# Patient Record
Sex: Female | Born: 1945 | Hispanic: No | Marital: Married | Smoking: Never smoker
Health system: Southern US, Community
[De-identification: ages and names within clinical notes are randomized; demographics above are authoritative.]

## PROBLEM LIST (undated history)

## (undated) DIAGNOSIS — C801 Malignant (primary) neoplasm, unspecified: Secondary | ICD-10-CM

## (undated) DIAGNOSIS — I10 Essential (primary) hypertension: Secondary | ICD-10-CM

## (undated) DIAGNOSIS — M1990 Unspecified osteoarthritis, unspecified site: Secondary | ICD-10-CM

## (undated) DIAGNOSIS — E785 Hyperlipidemia, unspecified: Secondary | ICD-10-CM

## (undated) DIAGNOSIS — M810 Age-related osteoporosis without current pathological fracture: Secondary | ICD-10-CM

## (undated) HISTORY — PX: TUBAL LIGATION: SHX77

## (undated) HISTORY — PX: CHOLECYSTECTOMY: SHX55

## (undated) HISTORY — DX: Unspecified osteoarthritis, unspecified site: M19.90

## (undated) HISTORY — DX: Age-related osteoporosis without current pathological fracture: M81.0

## (undated) HISTORY — DX: Essential (primary) hypertension: I10

## (undated) HISTORY — DX: Malignant (primary) neoplasm, unspecified: C80.1

## (undated) HISTORY — PX: OTHER SURGICAL HISTORY: SHX169

## (undated) HISTORY — DX: Hyperlipidemia, unspecified: E78.5

---

## 2000-06-20 ENCOUNTER — Other Ambulatory Visit: Admission: RE | Admit: 2000-06-20 | Discharge: 2000-06-20 | Payer: Self-pay | Admitting: Unknown Physician Specialty

## 2002-11-30 ENCOUNTER — Other Ambulatory Visit: Admission: RE | Admit: 2002-11-30 | Discharge: 2002-11-30 | Payer: Self-pay | Admitting: Family Medicine

## 2003-12-08 ENCOUNTER — Other Ambulatory Visit: Admission: RE | Admit: 2003-12-08 | Discharge: 2003-12-08 | Payer: Self-pay | Admitting: Family Medicine

## 2004-12-21 ENCOUNTER — Other Ambulatory Visit: Admission: RE | Admit: 2004-12-21 | Discharge: 2004-12-21 | Payer: Self-pay | Admitting: Family Medicine

## 2005-12-12 ENCOUNTER — Other Ambulatory Visit: Admission: RE | Admit: 2005-12-12 | Discharge: 2005-12-12 | Payer: Self-pay | Admitting: Family Medicine

## 2008-08-25 ENCOUNTER — Ambulatory Visit: Payer: Self-pay | Admitting: Vascular Surgery

## 2009-05-11 ENCOUNTER — Encounter (INDEPENDENT_AMBULATORY_CARE_PROVIDER_SITE_OTHER): Payer: Self-pay | Admitting: *Deleted

## 2010-12-05 NOTE — Consult Note (Signed)
NEW PATIENT CONSULTATION   Yvonne Rivers, Yvonne Rivers  DOB:  Feb 29, 1946                                       08/25/2008  CHART#:[id number]   The patient presents today for concern regarding discomfort in her lower  extremities.  She reports that this occurs most frequently at night  where she has a burning sensation on the bottoms of her feet.  She also  can have alternating hot and cold sensations.  She reports this began  around the time of Christmas.  She is also concerned regarding a strong  family history of premature atherosclerotic disease with cardiac  disease.  She has 13 siblings and multiple of these have had coronary  artery bypass grafting and stenting at a young age.  She does not have  any history of hypertension or diabetes.  She does have elevated  cholesterol and is on appropriate treatment for this.   SOCIAL HISTORY:  She is married with two children.  She works as a  Child psychotherapist.  She does not smoke or drink alcohol.  She has never smoked.   REVIEW OF SYSTEMS:  Her weight is reported at 137 pounds.  She is 5 feet  1-1/2 inches tall.  She denies cardiac, pulmonary, GI or GU discomfort.  She also denies any stroke or orthopedic difficulties.   ALLERGIES:  No known drug allergies.   MEDICATIONS:  1. Simvastatin.  2. Zolpidem.  3. Aspirin.  4. Fish oil.  5. Multivitamin.   PHYSICAL EXAMINATION:  Vital signs:  Blood pressure is 172/93, pulse is  74, respirations 18.  General:  She is a well-developed, well-nourished  in no acute distress.  She is grossly intact neurologically.  Her radial  pulses are 2+ bilaterally.  She has 2+ popliteal and 2+ posterior tibial  pulses bilaterally.  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  She does not have  any varicose veins and no chronic swelling or skin changes associated  with venous hypertension.  She underwent vascular laboratory studies in  our  office which reveals normal ankle arm indices bilaterally and normal  triphasic waveforms.   I discussed this at length with the patient.  I explained that she does  not appear to have any evidence of significant arterial or venous  pathology aside from her telangiectasia.  I have explained that I am  unclear as to the etiology of her paresthesias in her feet but these do  not appear to be related to vascular insufficiency.  She is questioning  regarding cardiac evaluation.  Apparently she is scheduled for a stress  test in March.  I feel this is appropriate due to her very strong family  history.  She will see [place] again on an as needed basis.   Larina Earthly, M.D.  Electronically Signed   TFE/MEDQ  D:  08/25/2008  T:  08/26/2008  Job:  [id number]   cc:   Ernestina Penna, M.D.

## 2012-11-19 ENCOUNTER — Other Ambulatory Visit: Payer: Self-pay | Admitting: Family Medicine

## 2013-01-09 ENCOUNTER — Other Ambulatory Visit: Payer: Self-pay

## 2013-01-09 NOTE — Telephone Encounter (Signed)
Last seen 2/14  [facility]  If approved call in and nurse notify patient

## 2013-01-10 MED ORDER — ZOLPIDEM TARTRATE 10 MG PO TABS
10.0000 mg | ORAL_TABLET | Freq: Every evening | ORAL | Status: DC | PRN
Start: 2013-01-09 — End: 2013-02-05

## 2013-01-10 NOTE — Telephone Encounter (Signed)
Please call in ambien rx with 2 refills 

## 2013-01-10 NOTE — Telephone Encounter (Signed)
CALLED INTO PHARMACY

## 2013-02-05 ENCOUNTER — Encounter: Payer: Self-pay | Admitting: General Practice

## 2013-02-05 ENCOUNTER — Ambulatory Visit (INDEPENDENT_AMBULATORY_CARE_PROVIDER_SITE_OTHER): Payer: BC Managed Care – PPO | Admitting: General Practice

## 2013-02-05 VITALS — BP 127/81 | HR 68 | Temp 97.2°F | Ht 60.0 in | Wt 120.0 lb

## 2013-02-05 DIAGNOSIS — M1990 Unspecified osteoarthritis, unspecified site: Secondary | ICD-10-CM

## 2013-02-05 DIAGNOSIS — M129 Arthropathy, unspecified: Secondary | ICD-10-CM

## 2013-02-05 DIAGNOSIS — I10 Essential (primary) hypertension: Secondary | ICD-10-CM

## 2013-02-05 DIAGNOSIS — Z09 Encounter for follow-up examination after completed treatment for conditions other than malignant neoplasm: Secondary | ICD-10-CM

## 2013-02-05 DIAGNOSIS — G4700 Insomnia, unspecified: Secondary | ICD-10-CM

## 2013-02-05 DIAGNOSIS — E785 Hyperlipidemia, unspecified: Secondary | ICD-10-CM

## 2013-02-05 DIAGNOSIS — G609 Hereditary and idiopathic neuropathy, unspecified: Secondary | ICD-10-CM

## 2013-02-05 LAB — COMPLETE METABOLIC PANEL WITH GFR
ALT: 20 U/L (ref 0–35)
CO2: 29 mEq/L (ref 19–32)
Calcium: 9.6 mg/dL (ref 8.4–10.5)
Chloride: 101 mEq/L (ref 96–112)
Creat: 0.69 mg/dL (ref 0.50–1.10)
GFR, Est African American: >89 mL/min

## 2013-02-05 LAB — POCT CBC
HCT, POC: 39.9 % (ref 37.7–47.9)
Hemoglobin: 13.9 g/dL (ref 12.2–16.2)
MCH, POC: 32.1 pg — AB (ref 27–31.2)
MCV: 92.7 fL (ref 80–97)
MPV: 8.3 fL (ref 0–99.8)
POC LYMPH PERCENT: 39.1 %L (ref 10–50)
RBC: 4.3 M/uL (ref 4.04–5.48)

## 2013-02-05 MED ORDER — LISINOPRIL 40 MG PO TABS: 40.0000 mg | ORAL_TABLET | Freq: Every day | ORAL | Status: DC

## 2013-02-05 MED ORDER — MELOXICAM 15 MG PO TABS: 15.0000 mg | ORAL_TABLET | Freq: Every day | ORAL | Status: DC

## 2013-02-05 MED ORDER — ATORVASTATIN CALCIUM 20 MG PO TABS: 20.0000 mg | ORAL_TABLET | Freq: Every day | ORAL | Status: DC

## 2013-02-05 MED ORDER — ZOLPIDEM TARTRATE 10 MG PO TABS: 10.0000 mg | ORAL_TABLET | Freq: Every evening | ORAL | Status: DC | PRN

## 2013-02-05 MED ORDER — GABAPENTIN 300 MG PO CAPS: 300.0000 mg | ORAL_CAPSULE | Freq: Three times a day (TID) | ORAL | Status: DC

## 2013-02-05 NOTE — Patient Instructions (Signed)
Hypertriglyceridemia  Diet for High blood levels of Triglycerides Most fats in food are triglycerides. Triglycerides in your blood are stored as fat in your body. High levels of triglycerides in your blood may put you at a greater risk for heart disease and stroke.  Normal triglyceride levels are less than 150 mg/dL. Borderline high levels are 150-199 mg/dl. High levels are 200 - 499 mg/dL, and very high triglyceride levels are greater than 500 mg/dL. The decision to treat high triglycerides is generally based on the level. For people with borderline or high triglyceride levels, treatment includes weight loss and exercise. Drugs are recommended for people with very high triglyceride levels. Many people who need treatment for high triglyceride levels have metabolic syndrome. This syndrome is a collection of disorders that often include: insulin resistance, high blood pressure, blood clotting problems, high cholesterol and triglycerides. TESTING PROCEDURE FOR TRIGLYCERIDES  You should not eat 4 hours before getting your triglycerides measured. The normal range of triglycerides is between 10 and 250 milligrams per deciliter (mg/dl). Some people may have extreme levels (1000 or above), but your triglyceride level may be too high if it is above 150 mg/dl, depending on what other risk factors you have for heart disease.  People with high blood triglycerides may also have high blood cholesterol levels. If you have high blood cholesterol as well as high blood triglycerides, your risk for heart disease is probably greater than if you only had high triglycerides. High blood cholesterol is one of the main risk factors for heart disease. CHANGING YOUR DIET  Your weight can affect your blood triglyceride level. If you are more than 20% above your ideal body weight, you may be able to lower your blood triglycerides by losing weight. Eating less and exercising regularly is the best way to combat this. Fat provides more  calories than any other food. The best way to lose weight is to eat less fat. Only 30% of your total calories should come from fat. Less than 7% of your diet should come from saturated fat. A diet low in fat and saturated fat is the same as a diet to decrease blood cholesterol. By eating a diet lower in fat, you may lose weight, lower your blood cholesterol, and lower your blood triglyceride level.  Eating a diet low in fat, especially saturated fat, may also help you lower your blood triglyceride level. Ask your dietitian to help you figure how much fat you can eat based on the number of calories your caregiver has prescribed for you.  Exercise, in addition to helping with weight loss may also help lower triglyceride levels.   Alcohol can increase blood triglycerides. You may need to stop drinking alcoholic beverages.  Too much carbohydrate in your diet may also increase your blood triglycerides. Some complex carbohydrates are necessary in your diet. These may include bread, rice, potatoes, other starchy vegetables and cereals.  Reduce "simple" carbohydrates. These may include pure sugars, candy, honey, and jelly without losing other nutrients. If you have the kind of high blood triglycerides that is affected by the amount of carbohydrates in your diet, you will need to eat less sugar and less high-sugar foods. Your caregiver can help you with this.  Adding 2-4 grams of fish oil (EPA+ DHA) may also help lower triglycerides. Speak with your caregiver before adding any supplements to your regimen. Following the Diet  Maintain your ideal weight. Your caregivers can help you with a diet. Generally, eating less food and getting more   exercise will help you lose weight. Joining a weight control group may also help. Ask your caregivers for a good weight control group in your area.  Eat low-fat foods instead of high-fat foods. This can help you lose weight too.  These foods are lower in fat. Eat MORE of these:    Dried beans, peas, and lentils.  Egg whites.  Low-fat cottage cheese.  Fish.  Lean cuts of meat, such as round, sirloin, rump, and flank (cut extra fat off meat you fix).  Whole grain breads, cereals and pasta.  Skim and nonfat dry milk.  Low-fat yogurt.  Poultry without the skin.  Cheese made with skim or part-skim milk, such as mozzarella, parmesan, farmers', ricotta, or pot cheese. These are higher fat foods. Eat LESS of these:   Whole milk and foods made from whole milk, such as American, blue, cheddar, monterey jack, and swiss cheese  High-fat meats, such as luncheon meats, sausages, knockwurst, bratwurst, hot dogs, ribs, corned beef, ground pork, and regular ground beef.  Fried foods. Limit saturated fats in your diet. Substituting unsaturated fat for saturated fat may decrease your blood triglyceride level. You will need to read package labels to know which products contain saturated fats.  These foods are high in saturated fat. Eat LESS of these:   Fried pork skins.  Whole milk.  Skin and fat from poultry.  Palm oil.  Butter.  Shortening.  Cream cheese.  Bacon.  Margarines and baked goods made from listed oils.  Vegetable shortenings.  Chitterlings.  Fat from meats.  Coconut oil.  Palm kernel oil.  Lard.  Cream.  Sour cream.  Fatback.  Coffee whiteners and non-dairy creamers made with these oils.  Cheese made from whole milk. Use unsaturated fats (both polyunsaturated and monounsaturated) moderately. Remember, even though unsaturated fats are better than saturated fats; you still want a diet low in total fat.  These foods are high in unsaturated fat:   Canola oil.  Sunflower oil.  Mayonnaise.  Almonds.  Peanuts.  Pine nuts.  Margarines made with these oils.  Safflower oil.  Olive oil.  Avocados.  Cashews.  Peanut butter.  Sunflower seeds.  Soybean oil.  Peanut  oil.  Olives.  Pecans.  Walnuts.  Pumpkin seeds. Avoid sugar and other high-sugar foods. This will decrease carbohydrates without decreasing other nutrients. Sugar in your food goes rapidly to your blood. When there is excess sugar in your blood, your liver may use it to make more triglycerides. Sugar also contains calories without other important nutrients.  Eat LESS of these:   Sugar, brown sugar, powdered sugar, jam, jelly, preserves, honey, syrup, molasses, pies, candy, cakes, cookies, frosting, pastries, colas, soft drinks, punches, fruit drinks, and regular gelatin.  Avoid alcohol. Alcohol, even more than sugar, may increase blood triglycerides. In addition, alcohol is high in calories and low in nutrients. Ask for sparkling water, or a diet soft drink instead of an alcoholic beverage. Suggestions for planning and preparing meals   Bake, broil, grill or roast meats instead of frying.  Remove fat from meats and skin from poultry before cooking.  Add spices, herbs, lemon juice or vinegar to vegetables instead of salt, rich sauces or gravies.  Use a non-stick skillet without fat or use no-stick sprays.  Cool and refrigerate stews and broth. Then remove the hardened fat floating on the surface before serving.  Refrigerate meat drippings and skim off fat to make low-fat gravies.  Serve more fish.  Use less butter,   margarine and other high-fat spreads on bread or vegetables.  Use skim or reconstituted non-fat dry milk for cooking.  Cook with low-fat cheeses.  Substitute low-fat yogurt or cottage cheese for all or part of the sour cream in recipes for sauces, dips or congealed salads.  Use half yogurt/half mayonnaise in salad recipes.  Substitute evaporated skim milk for cream. Evaporated skim milk or reconstituted non-fat dry milk can be whipped and substituted for whipped cream in certain recipes.  Choose fresh fruits for dessert instead of high-fat foods such as pies or  cakes. Fruits are naturally low in fat. When Dining Out   Order low-fat appetizers such as fruit or vegetable juice, pasta with vegetables or tomato sauce.  Select clear, rather than cream soups.  Ask that dressings and gravies be served on the side. Then use less of them.  Order foods that are baked, broiled, poached, steamed, stir-fried, or roasted.  Ask for margarine instead of butter, and use only a small amount.  Drink sparkling water, unsweetened tea or coffee, or diet soft drinks instead of alcohol or other sweet beverages. QUESTIONS AND ANSWERS ABOUT OTHER FATS IN THE BLOOD: SATURATED FAT, TRANS FAT, AND CHOLESTEROL What is trans fat? Trans fat is a type of fat that is formed when vegetable oil is hardened through a process called hydrogenation. This process helps makes foods more solid, gives them shape, and prolongs their shelf life. Trans fats are also called hydrogenated or partially hydrogenated oils.  What do saturated fat, trans fat, and cholesterol in foods have to do with heart disease? Saturated fat, trans fat, and cholesterol in the diet all raise the level of LDL "bad" cholesterol in the blood. The higher the LDL cholesterol, the greater the risk for coronary heart disease (CHD). Saturated fat and trans fat raise LDL similarly.  What foods contain saturated fat, trans fat, and cholesterol? High amounts of saturated fat are found in animal products, such as fatty cuts of meat, chicken skin, and full-fat dairy products like butter, whole milk, cream, and cheese, and in tropical vegetable oils such as palm, palm kernel, and coconut oil. Trans fat is found in some of the same foods as saturated fat, such as vegetable shortening, some margarines (especially hard or stick margarine), crackers, cookies, baked goods, fried foods, salad dressings, and other processed foods made with partially hydrogenated vegetable oils. Small amounts of trans fat also occur naturally in some animal  products, such as milk products, beef, and lamb. Foods high in cholesterol include liver, other organ meats, egg yolks, shrimp, and full-fat dairy products. How can I use the new food label to make heart-healthy food choices? Check the Nutrition Facts panel of the food label. Choose foods lower in saturated fat, trans fat, and cholesterol. For saturated fat and cholesterol, you can also use the Percent Daily Value (%DV): 5% DV or less is low, and 20% DV or more is high. (There is no %DV for trans fat.) Use the Nutrition Facts panel to choose foods low in saturated fat and cholesterol, and if the trans fat is not listed, read the ingredients and limit products that list shortening or hydrogenated or partially hydrogenated vegetable oil, which tend to be high in trans fat. POINTS TO REMEMBER:   Discuss your risk for heart disease with your caregivers, and take steps to reduce risk factors.  Change your diet. Choose foods that are low in saturated fat, trans fat, and cholesterol.  Add exercise to your daily routine if   it is not already being done. Participate in physical activity of moderate intensity, like brisk walking, for at least 30 minutes on most, and preferably all days of the week. No time? Break the 30 minutes into three, 10-minute segments during the day.  Stop smoking. If you do smoke, contact your caregiver to discuss ways in which they can help you quit.  Do not use street drugs.  Maintain a normal weight.  Maintain a healthy blood pressure.  Keep up with your blood work for checking the fats in your blood as directed by your caregiver. Document Released: 04/26/2004 Document Revised: 01/08/2012 Document Reviewed: 11/22/2008 Exit[employer] Patient Information 2014 [employer].  

## 2013-02-05 NOTE — Progress Notes (Signed)
  Subjective:    Patient ID: Luella Cook, female    DOB: 09-10-1945, 67 y.o.   MRN: [id number]  HPI Patient presents today for chronic health conditions follow up. She has a history of hypertension, insomnia, arthritis, hyperlipidemia, and neuropathy. She reports checking blood pressure at home three times weekly and ranges from 109-120's/70's. She reports taking medications as prescribed. She denies any health concerns at this time.     Review of Systems  Constitutional: Negative for fever and chills.  HENT: Negative for neck pain and neck stiffness.   Respiratory: Negative for chest tightness and shortness of breath.   Cardiovascular: Negative for chest pain and palpitations.  Gastrointestinal: Negative for nausea, vomiting, abdominal pain and blood in stool.  Genitourinary: Negative for dysuria, hematuria and difficulty urinating.  Musculoskeletal: Negative for back pain.       Arthritis, but pain relieved by mobic  Neurological: Negative for dizziness, weakness and headaches.       Objective:   Physical Exam  Constitutional: She is oriented to person, place, and time. She appears well-developed and well-nourished.  HENT:  Head: Normocephalic and atraumatic.  Right Ear: External ear normal.  Left Ear: External ear normal.  Mouth/Throat: Oropharynx is clear and moist.  Cardiovascular: Normal rate, regular rhythm and normal heart sounds.   Pulmonary/Chest: Effort normal and breath sounds normal. No respiratory distress. She exhibits no tenderness.  Neurological: She is alert and oriented to person, place, and time.  Skin: Skin is warm and dry.  Psychiatric: She has a normal mood and affect.          Assessment & Plan:  1. Essential hypertension, benign - lisinopril (PRINIVIL,ZESTRIL) 40 MG tablet; Take 1 tablet (40 mg total) by mouth daily.  Dispense: 30 tablet; Refill: 3  2. Other and unspecified hyperlipidemia - NMR Lipoprofile with Lipids - atorvastatin (LIPITOR) 20  MG tablet; Take 1 tablet (20 mg total) by mouth daily.  Dispense: 30 tablet; Refill: 3  3. Insomnia - zolpidem (AMBIEN) 10 MG tablet; Take 1 tablet (10 mg total) by mouth at bedtime as needed for sleep.  Dispense: 30 tablet; Refill: 3  4. Arthritis - meloxicam (MOBIC) 15 MG tablet; Take 1 tablet (15 mg total) by mouth daily.  Dispense: 30 tablet; Refill: 3  5. Unspecified hereditary and idiopathic peripheral neuropathy - zolpidem (AMBIEN) 10 MG tablet; Take 1 tablet (10 mg total) by mouth at bedtime as needed for sleep.  Dispense: 30 tablet; Refill: 3  6. Follow-up exam, 3-6 months since previous exam - POCT CBC - COMPLETE METABOLIC PANEL WITH GFR -Continue all current medications Labs pending F/u in 3 months and sooner if symptoms develop or seek emergency medical attention Discussed exercise and healthy eating Patient verbalized understanding Coralie Keens, FNP-C

## 2013-02-06 LAB — NMR LIPOPROFILE WITH LIPIDS
HDL Particle Number: 42.1 umol/L (ref >=30.5)
LDL (calc): 84 mg/dL (ref <100)
LDL Size: 21.1 nm (ref >20.5)
LP-IR Score: <25 (ref <=45)
Large HDL-P: 17.1 umol/L (ref >=4.8)
Small LDL Particle Number: <90 nmol/L (ref <=527)

## 2013-05-25 ENCOUNTER — Other Ambulatory Visit: Payer: Self-pay | Admitting: *Deleted

## 2013-05-25 DIAGNOSIS — E785 Hyperlipidemia, unspecified: Secondary | ICD-10-CM

## 2013-05-25 MED ORDER — ATORVASTATIN CALCIUM 20 MG PO TABS: 20.0000 mg | ORAL_TABLET | Freq: Every day | ORAL | Status: DC

## 2013-07-13 ENCOUNTER — Other Ambulatory Visit: Payer: Self-pay | Admitting: *Deleted

## 2013-07-13 DIAGNOSIS — I10 Essential (primary) hypertension: Secondary | ICD-10-CM

## 2013-07-13 DIAGNOSIS — E785 Hyperlipidemia, unspecified: Secondary | ICD-10-CM

## 2013-07-13 MED ORDER — ATORVASTATIN CALCIUM 20 MG PO TABS: 20.0000 mg | ORAL_TABLET | Freq: Every day | ORAL | Status: DC

## 2013-07-13 MED ORDER — LISINOPRIL 40 MG PO TABS: 40.0000 mg | ORAL_TABLET | Freq: Every day | ORAL | Status: DC

## 2013-07-14 ENCOUNTER — Other Ambulatory Visit: Payer: Self-pay

## 2013-07-14 DIAGNOSIS — G609 Hereditary and idiopathic neuropathy, unspecified: Secondary | ICD-10-CM

## 2013-07-14 DIAGNOSIS — G4700 Insomnia, unspecified: Secondary | ICD-10-CM

## 2013-07-14 NOTE — Telephone Encounter (Signed)
Last seen 02/05/13  Yvonne Rivers  If approved route to nurse to call into [facility]

## 2013-07-15 MED ORDER — ZOLPIDEM TARTRATE 10 MG PO TABS: 10.0000 mg | ORAL_TABLET | Freq: Every evening | ORAL | Status: DC | PRN

## 2013-07-20 NOTE — Telephone Encounter (Signed)
Called in to [facility] 

## 2013-10-29 ENCOUNTER — Telehealth: Payer: Self-pay | Admitting: General Practice

## 2013-10-29 NOTE — Telephone Encounter (Signed)
appt with mae on 4/24 patient stated she had enough medication to last until then

## 2013-11-13 ENCOUNTER — Encounter: Payer: Self-pay | Admitting: General Practice

## 2013-11-13 ENCOUNTER — Ambulatory Visit (INDEPENDENT_AMBULATORY_CARE_PROVIDER_SITE_OTHER): Payer: Medicare Other | Admitting: General Practice

## 2013-11-13 VITALS — BP 135/81 | HR 65 | Temp 96.3°F | Ht 60.0 in | Wt 113.0 lb

## 2013-11-13 DIAGNOSIS — G609 Hereditary and idiopathic neuropathy, unspecified: Secondary | ICD-10-CM

## 2013-11-13 DIAGNOSIS — E785 Hyperlipidemia, unspecified: Secondary | ICD-10-CM

## 2013-11-13 DIAGNOSIS — M1990 Unspecified osteoarthritis, unspecified site: Secondary | ICD-10-CM

## 2013-11-13 DIAGNOSIS — M129 Arthropathy, unspecified: Secondary | ICD-10-CM

## 2013-11-13 DIAGNOSIS — G4700 Insomnia, unspecified: Secondary | ICD-10-CM

## 2013-11-13 DIAGNOSIS — I10 Essential (primary) hypertension: Secondary | ICD-10-CM

## 2013-11-13 DIAGNOSIS — G629 Polyneuropathy, unspecified: Secondary | ICD-10-CM

## 2013-11-13 MED ORDER — LISINOPRIL 40 MG PO TABS: 40.0000 mg | ORAL_TABLET | Freq: Every day | ORAL | Status: DC

## 2013-11-13 MED ORDER — GABAPENTIN 300 MG PO CAPS: 300.0000 mg | ORAL_CAPSULE | Freq: Three times a day (TID) | ORAL | Status: DC

## 2013-11-13 MED ORDER — MELOXICAM 15 MG PO TABS: 15.0000 mg | ORAL_TABLET | Freq: Every day | ORAL | Status: DC

## 2013-11-13 MED ORDER — ATORVASTATIN CALCIUM 20 MG PO TABS: 20.0000 mg | ORAL_TABLET | Freq: Every day | ORAL | Status: DC

## 2013-11-13 MED ORDER — ZOLPIDEM TARTRATE 10 MG PO TABS: 10.0000 mg | ORAL_TABLET | Freq: Every evening | ORAL | Status: DC | PRN

## 2013-11-13 NOTE — Progress Notes (Signed)
   Subjective:    Patient ID: Yvonne Rivers, female    DOB: Dec 07, 1945, 68 y.o.   MRN: [id number]  HPI Patient presents today for chronic health conditions follow up. History of hypertension, insomnia, arthritis, hyperlipidemia, and neuropathy. Continues checking blood pressure at home three times weekly, 109-120's/70's. Taking medications as prescribed. She denies any health concerns at this time.      Review of Systems  Constitutional: Negative for fever and chills.  Respiratory: Negative for chest tightness and shortness of breath.   Cardiovascular: Negative for chest pain and palpitations.  Gastrointestinal: Negative for nausea, vomiting, abdominal pain and blood in stool.  Genitourinary: Negative for dysuria, hematuria and difficulty urinating.  Musculoskeletal: Negative for back pain, neck pain and neck stiffness.       Arthritis  Neurological: Negative for dizziness, weakness and headaches.       Objective:   Physical Exam  Constitutional: She is oriented to person, place, and time. She appears well-developed and well-nourished.  HENT:  Head: Normocephalic and atraumatic.  Right Ear: External ear normal.  Left Ear: External ear normal.  Mouth/Throat: Oropharynx is clear and moist.  Neck: Normal range of motion. Neck supple. No thyromegaly present.  Cardiovascular: Normal rate, regular rhythm and normal heart sounds.   Pulmonary/Chest: Effort normal and breath sounds normal. No respiratory distress. She exhibits no tenderness.  Abdominal: Soft. Bowel sounds are normal. She exhibits no distension. There is no tenderness.  Lymphadenopathy:    She has no cervical adenopathy.  Neurological: She is alert and oriented to person, place, and time.  Skin: Skin is warm and dry.  Psychiatric: She has a normal mood and affect.          Assessment & Plan:  1. Arthritis  - meloxicam (MOBIC) 15 MG tablet; Take 1 tablet (15 mg total) by mouth daily.  Dispense: 30 tablet; Refill:  5  2. Essential hypertension, benign  - lisinopril (PRINIVIL,ZESTRIL) 40 MG tablet; Take 1 tablet (40 mg total) by mouth daily.  Dispense: 30 tablet; Refill: 5 - CMP14+EGFR  3. Other and unspecified hyperlipidemia  - atorvastatin (LIPITOR) 20 MG tablet; Take 1 tablet (20 mg total) by mouth daily.  Dispense: 30 tablet; Refill: 5 - Lipid panel  4. Insomnia  - zolpidem (AMBIEN) 10 MG tablet; Take 1 tablet (10 mg total) by mouth at bedtime as needed for sleep.  Dispense: 30 tablet; Refill: 1  5. Unspecified hereditary and idiopathic peripheral neuropathy  - zolpidem (AMBIEN) 10 MG tablet; Take 1 tablet (10 mg total) by mouth at bedtime as needed for sleep.  Dispense: 30 tablet; Refill: 1  6. Peripheral neuropathy - gabapentin (NEURONTIN) 300 MG capsule; Take 1 capsule (300 mg total) by mouth 3 (three) times daily.  Dispense: 30 capsule; Refill: 5 -Continue all current medications Labs pending F/u in 3 months Discussed benefits of regular exercise and healthy eating Patient verbalized understanding Erby Pian, FNP-C

## 2013-11-13 NOTE — Patient Instructions (Signed)

## 2013-11-14 LAB — LIPID PANEL
Chol/HDL Ratio: 2.1 ratio units (ref 0.0–4.4)
Cholesterol, Total: 178 mg/dL (ref 100–199)
HDL: 84 mg/dL (ref >39)
LDL Calculated: 82 mg/dL (ref 0–99)
TRIGLYCERIDES: 62 mg/dL (ref 0–149)
VLDL CHOLESTEROL CAL: 12 mg/dL (ref 5–40)

## 2013-11-14 LAB — CMP14+EGFR
ALT: 20 IU/L (ref 0–32)
AST: 35 IU/L (ref 0–40)
Albumin/Globulin Ratio: 2 (ref 1.1–2.5)
Albumin: 4.3 g/dL (ref 3.6–4.8)
Alkaline Phosphatase: 58 IU/L (ref 39–117)
BUN/Creatinine Ratio: 11 (ref 11–26)
BUN: 8 mg/dL (ref 8–27)
CALCIUM: 9.6 mg/dL (ref 8.7–10.3)
CHLORIDE: 102 mmol/L (ref 97–108)
CO2: 27 mmol/L (ref 18–29)
Creatinine, Ser: 0.7 mg/dL (ref 0.57–1.00)
GFR calc Af Amer: 105 mL/min/{1.73_m2} (ref >59)
GFR, EST NON AFRICAN AMERICAN: 91 mL/min/{1.73_m2} (ref >59)
GLUCOSE: 83 mg/dL (ref 65–99)
Globulin, Total: 2.1 g/dL (ref 1.5–4.5)
POTASSIUM: 4.2 mmol/L (ref 3.5–5.2)
SODIUM: 143 mmol/L (ref 134–144)
Total Bilirubin: 1.2 mg/dL (ref 0.0–1.2)
Total Protein: 6.4 g/dL (ref 6.0–8.5)

## 2013-11-16 ENCOUNTER — Telehealth: Payer: Self-pay | Admitting: General Practice

## 2013-11-16 ENCOUNTER — Other Ambulatory Visit: Payer: Self-pay | Admitting: General Practice

## 2013-11-16 DIAGNOSIS — G629 Polyneuropathy, unspecified: Secondary | ICD-10-CM

## 2013-11-16 MED ORDER — GABAPENTIN 300 MG PO CAPS: 300.0000 mg | ORAL_CAPSULE | Freq: Three times a day (TID) | ORAL | Status: DC

## 2013-11-16 NOTE — Telephone Encounter (Signed)
90 supply of gabapentin sent to pharmacy. Zolpidem is generic for [facility]

## 2013-11-20 ENCOUNTER — Telehealth: Payer: Self-pay | Admitting: Family Medicine

## 2013-11-20 NOTE — Telephone Encounter (Signed)
Patient aware.

## 2013-11-20 NOTE — Telephone Encounter (Signed)
Message copied by Waverly Ferrari on Fri Nov 20, 2013 11:11 AM ------      Message from: Aleen Sells E      Created: Thu Nov 19, 2013 12:29 PM       Please inform patient that all labs were normal. ------

## 2013-12-01 ENCOUNTER — Other Ambulatory Visit: Payer: Self-pay | Admitting: General Practice

## 2014-03-01 ENCOUNTER — Ambulatory Visit (INDEPENDENT_AMBULATORY_CARE_PROVIDER_SITE_OTHER): Payer: Medicare Other | Admitting: Family Medicine

## 2014-03-01 ENCOUNTER — Encounter: Payer: Self-pay | Admitting: Family Medicine

## 2014-03-01 VITALS — BP 118/81 | HR 63 | Temp 98.1°F | Ht 60.0 in | Wt 114.6 lb

## 2014-03-01 DIAGNOSIS — J069 Acute upper respiratory infection, unspecified: Secondary | ICD-10-CM

## 2014-03-01 MED ORDER — AZITHROMYCIN 250 MG PO TABS: ORAL_TABLET | ORAL | Status: DC

## 2014-03-01 NOTE — Progress Notes (Signed)
   Subjective:    Patient ID: Yvonne Rivers, female    DOB: [birth year], 68 y.o.   MRN: [id number]  HPI  This 68 y.o. female presents for evaluation of c/o uri sx's and head congestion.  Review of Systems    No chest pain, SOB, HA, dizziness, vision change, N/V, diarrhea, constipation, dysuria, urinary urgency or frequency, myalgias, arthralgias or rash.  Objective:   Physical Exam Vital signs noted  Well developed well nourished female.  HEENT - Head atraumatic Normocephalic                Eyes - PERRLA, Conjuctiva - clear Sclera- Clear EOMI                Ears - EAC's Wnl TM's Wnl Gross Hearing WNL                Throat - oropharanx wnl Respiratory - Lungs CTA bilateral Cardiac - RRR S1 and S2 without murmur GI - Abdomen soft Nontender and bowel sounds active x 4 Extremities - No edema. Neuro - Grossly intact.       Assessment & Plan:  URI (upper respiratory infection) - Plan: azithromycin (ZITHROMAX) 250 MG tablet Push po fluids, rest, tylenol and motrin otc prn as directed for fever, arthralgias, and myalgias.  Follow up prn if sx's continue or persist.  Lysbeth Penner FNP

## 2014-03-17 ENCOUNTER — Other Ambulatory Visit: Payer: Self-pay | Admitting: General Practice

## 2014-03-19 ENCOUNTER — Encounter: Payer: Self-pay | Admitting: Nurse Practitioner

## 2014-03-19 ENCOUNTER — Ambulatory Visit (INDEPENDENT_AMBULATORY_CARE_PROVIDER_SITE_OTHER): Payer: Medicare Other | Admitting: Nurse Practitioner

## 2014-03-19 VITALS — BP 150/77 | HR 62 | Temp 97.4°F | Ht 60.0 in | Wt 113.0 lb

## 2014-03-19 DIAGNOSIS — G609 Hereditary and idiopathic neuropathy, unspecified: Secondary | ICD-10-CM

## 2014-03-19 DIAGNOSIS — I10 Essential (primary) hypertension: Secondary | ICD-10-CM

## 2014-03-19 DIAGNOSIS — G4700 Insomnia, unspecified: Secondary | ICD-10-CM

## 2014-03-19 MED ORDER — ZOLPIDEM TARTRATE 10 MG PO TABS: 10.0000 mg | ORAL_TABLET | Freq: Every evening | ORAL | Status: DC | PRN

## 2014-03-19 NOTE — Telephone Encounter (Signed)
Last seen 03/01/14  [facility]  If approved route to nurse to call into [facility]

## 2014-03-19 NOTE — Patient Instructions (Signed)

## 2014-03-19 NOTE — Progress Notes (Signed)
   Subjective:    Patient ID: Yvonne Rivers, female    DOB: 27-Apr-1946, 69 y.o.   MRN: [id number]  HPI Patient is in today c/o elevated blood pressure- SHe is currently on lisinopril $RemoveBefor'40mg'xpEAOfwxeTRw$  and patient stopped taking entire dose and has been taking 1/2 of pill because she thought her blood pressure was to low at 762 systolic. Now her blood pressure is running in the 831'D systolic.    Review of Systems  Constitutional: Negative.   HENT: Negative.   Respiratory: Negative.   Cardiovascular: Negative.   Genitourinary: Negative.   Neurological: Negative.   Psychiatric/Behavioral: Negative.   All other systems reviewed and are negative.      Objective:   Physical Exam  Constitutional: She is oriented to person, place, and time. She appears well-developed and well-nourished.  Cardiovascular: Normal rate, regular rhythm and normal heart sounds.   Pulmonary/Chest: Effort normal and breath sounds normal.  Neurological: She is alert and oriented to person, place, and time. She has normal reflexes.  Skin: Skin is warm.  Psychiatric: She has a normal mood and affect. Her behavior is normal. Judgment and thought content normal.   BP 150/77  Pulse 62  Temp(Src) 97.4 F (36.3 C) (Oral)  Ht 5' (1.524 m)  Wt 113 lb (51.256 kg)  BMI 22.07 kg/m2        Assessment & Plan:   1. Essential hypertension, malignant    Orders Placed This Encounter  Procedures  . BMP8+EGFR   Need to go back to lisinopril $RemoveBefor'40mg'[id number]$  daily Keep diary of blood pressure RTO prn  Mary-Margaret Hassell Done, FNP

## 2014-03-20 ENCOUNTER — Other Ambulatory Visit: Payer: Self-pay | Admitting: General Practice

## 2014-03-20 LAB — BMP8+EGFR
BUN/Creatinine Ratio: 9 — ABNORMAL LOW (ref 11–26)
BUN: 7 mg/dL — ABNORMAL LOW (ref 8–27)
CO2: 26 mmol/L (ref 18–29)
Calcium: 9.3 mg/dL (ref 8.7–10.3)
Chloride: 100 mmol/L (ref 97–108)
Creatinine, Ser: 0.78 mg/dL (ref 0.57–1.00)
GFR, EST AFRICAN AMERICAN: 92 mL/min/{1.73_m2} (ref >59)
GFR, EST NON AFRICAN AMERICAN: 80 mL/min/{1.73_m2} (ref >59)
Glucose: 88 mg/dL (ref 65–99)
Potassium: 4.6 mmol/L (ref 3.5–5.2)
Sodium: 143 mmol/L (ref 134–144)

## 2014-05-18 LAB — HM DIABETES EYE EXAM

## 2014-05-25 ENCOUNTER — Encounter: Payer: Self-pay | Admitting: *Deleted

## 2014-06-01 ENCOUNTER — Ambulatory Visit (INDEPENDENT_AMBULATORY_CARE_PROVIDER_SITE_OTHER): Payer: Medicare Other | Admitting: *Deleted

## 2014-06-01 ENCOUNTER — Ambulatory Visit (INDEPENDENT_AMBULATORY_CARE_PROVIDER_SITE_OTHER): Payer: Medicare Other

## 2014-06-01 ENCOUNTER — Encounter (INDEPENDENT_AMBULATORY_CARE_PROVIDER_SITE_OTHER): Payer: Self-pay

## 2014-06-01 ENCOUNTER — Encounter: Payer: Self-pay | Admitting: Nurse Practitioner

## 2014-06-01 ENCOUNTER — Ambulatory Visit (INDEPENDENT_AMBULATORY_CARE_PROVIDER_SITE_OTHER): Payer: Medicare Other | Admitting: Nurse Practitioner

## 2014-06-01 VITALS — BP 125/75 | HR 70 | Temp 97.9°F | Ht 60.0 in | Wt 112.0 lb

## 2014-06-01 DIAGNOSIS — G629 Polyneuropathy, unspecified: Secondary | ICD-10-CM

## 2014-06-01 DIAGNOSIS — I10 Essential (primary) hypertension: Secondary | ICD-10-CM

## 2014-06-01 DIAGNOSIS — Z23 Encounter for immunization: Secondary | ICD-10-CM

## 2014-06-01 DIAGNOSIS — Z01419 Encounter for gynecological examination (general) (routine) without abnormal findings: Secondary | ICD-10-CM

## 2014-06-01 DIAGNOSIS — Z13820 Encounter for screening for osteoporosis: Secondary | ICD-10-CM

## 2014-06-01 DIAGNOSIS — M545 Low back pain, unspecified: Secondary | ICD-10-CM | POA: Insufficient documentation

## 2014-06-01 DIAGNOSIS — G4700 Insomnia, unspecified: Secondary | ICD-10-CM | POA: Insufficient documentation

## 2014-06-01 DIAGNOSIS — G609 Hereditary and idiopathic neuropathy, unspecified: Secondary | ICD-10-CM | POA: Insufficient documentation

## 2014-06-01 DIAGNOSIS — Z0000 Encounter for general adult medical examination without abnormal findings: Secondary | ICD-10-CM

## 2014-06-01 DIAGNOSIS — E785 Hyperlipidemia, unspecified: Secondary | ICD-10-CM | POA: Insufficient documentation

## 2014-06-01 LAB — POCT URINALYSIS DIPSTICK
BILIRUBIN UA: NEGATIVE
GLUCOSE UA: NEGATIVE
Ketones, UA: NEGATIVE
Leukocytes, UA: NEGATIVE
Nitrite, UA: NEGATIVE
Protein, UA: NEGATIVE
RBC UA: NEGATIVE
SPEC GRAV UA: 1.01
Urobilinogen, UA: NEGATIVE
pH, UA: 5

## 2014-06-01 LAB — POCT CBC
GRANULOCYTE PERCENT: 46.9 % (ref 37–80)
HEMATOCRIT: 37.9 % (ref 37.7–47.9)
Hemoglobin: 12.8 g/dL (ref 12.2–16.2)
LYMPH, POC: 2.1 (ref 0.6–3.4)
MCH, POC: 31.4 pg — AB (ref 27–31.2)
MCHC: 33.9 g/dL (ref 31.8–35.4)
MCV: 92.5 fL (ref 80–97)
MPV: 8.4 fL (ref 0–99.8)
PLATELET COUNT, POC: 196 10*3/uL (ref 142–424)
POC Granulocyte: 2.1 (ref 2–6.9)
POC LYMPH PERCENT: 46.1 %L (ref 10–50)
RBC: 4.1 M/uL (ref 4.04–5.48)
RDW, POC: 13 %
WBC: 4.5 10*3/uL — AB (ref 4.6–10.2)

## 2014-06-01 LAB — POCT UA - MICROSCOPIC ONLY
BACTERIA, U MICROSCOPIC: NEGATIVE
Casts, Ur, LPF, POC: NEGATIVE
Crystals, Ur, HPF, POC: NEGATIVE
Mucus, UA: NEGATIVE
RBC, URINE, MICROSCOPIC: NEGATIVE
WBC, UR, HPF, POC: NEGATIVE
Yeast, UA: NEGATIVE

## 2014-06-01 MED ORDER — TRAZODONE HCL 50 MG PO TABS: 25.0000 mg | ORAL_TABLET | Freq: Every evening | ORAL | Status: DC | PRN

## 2014-06-01 MED ORDER — LISINOPRIL 40 MG PO TABS: 40.0000 mg | ORAL_TABLET | Freq: Every day | ORAL | Status: DC

## 2014-06-01 MED ORDER — ATORVASTATIN CALCIUM 20 MG PO TABS: 20.0000 mg | ORAL_TABLET | Freq: Every day | ORAL | Status: DC

## 2014-06-01 MED ORDER — GABAPENTIN 300 MG PO CAPS: 300.0000 mg | ORAL_CAPSULE | Freq: Three times a day (TID) | ORAL | Status: DC

## 2014-06-01 NOTE — Progress Notes (Signed)
 Subjective:    Patient ID: Yvonne Rivers, female    DOB: 07/22/1946, 68 y.o.   MRN: [id number]  Hypertension This is a chronic problem. The current episode started more than 1 year ago. The problem is unchanged. The problem is controlled. Pertinent negatives include no anxiety, chest pain, headaches, palpitations, peripheral edema or shortness of breath. Risk factors for coronary artery disease include stress, post-menopausal state and dyslipidemia. Past treatments include ACE inhibitors. The current treatment provides significant improvement. Compliance problems include diet and exercise.   Hyperlipidemia This is a chronic problem. The current episode started more than 1 year ago. The problem is controlled. Recent lipid tests were reviewed and are normal. She has no history of diabetes, hypothyroidism or obesity. Pertinent negatives include no chest pain or shortness of breath. Current antihyperlipidemic treatment includes statins. The current treatment provides significant improvement of lipids. There are no compliance problems.  Risk factors for coronary artery disease include dyslipidemia, hypertension and post-menopausal.  insomnia Takes ambien to sleep- getting expensive -would like to try trazadone. Low back pain Takes meloxicam as needed- does not take every day. Pain intermittent and stays in lower back. Neuropathy in feet Patient on gabapentin with some relief  Review of Systems  Constitutional: Negative.   Respiratory: Negative for shortness of breath.   Cardiovascular: Negative for chest pain and palpitations.  Genitourinary: Negative.   Neurological: Negative for headaches.  Psychiatric/Behavioral: Negative.   All other systems reviewed and are negative.      Objective:   Physical Exam  Constitutional: She is oriented to person, place, and time. She appears well-developed and well-nourished.  HENT:  Head: Normocephalic.  Right Ear: Hearing, tympanic membrane, external  ear and ear canal normal.  Left Ear: Hearing, tympanic membrane, external ear and ear canal normal.  Nose: Nose normal.  Mouth/Throat: Uvula is midline and oropharynx is clear and moist.  Eyes: Conjunctivae and EOM are normal. Pupils are equal, round, and reactive to light.  Neck: Normal range of motion and full passive range of motion without pain. Neck supple. No JVD present. Carotid bruit is not present. No thyroid mass and no thyromegaly present.  Cardiovascular: Normal rate, normal heart sounds and intact distal pulses.   No murmur heard. Pulmonary/Chest: Effort normal and breath sounds normal. Right breast exhibits no inverted nipple, no mass, no nipple discharge, no skin change and no tenderness. Left breast exhibits no inverted nipple, no mass, no nipple discharge, no skin change and no tenderness.  Abdominal: Soft. Bowel sounds are normal. She exhibits no mass. There is no tenderness.  Genitourinary: Vagina normal and uterus normal. Guaiac negative stool. No breast swelling, tenderness, discharge or bleeding.  bimanual exam-No adnexal masses or tenderness. Cervix parous and pink- no diacharge  Musculoskeletal: Normal range of motion.  Lymphadenopathy:    She has no cervical adenopathy.  Neurological: She is alert and oriented to person, place, and time.  Skin: Skin is warm and dry.  Psychiatric: She has a normal mood and affect. Her behavior is normal. Judgment and thought content normal.    BP 125/75 mmHg  Pulse 70  Temp(Src) 97.9 F (36.6 C) (Oral)  Ht 5' (1.524 m)  Wt 112 lb (50.803 kg)  BMI 21.87 kg/m2       Assessment & Plan:  1. Annual physical exam  - POCT urinalysis dipstick - POCT UA - Microscopic Only - POCT CBC - Thyroid Panel With TSH  2. Essential hypertension, benign Low NA+ diet - CMP14+EGFR - DG   Chest 2 View; Future - EKG 12-Lead - lisinopril (PRINIVIL,ZESTRIL) 40 MG tablet; Take 1 tablet (40 mg total) by mouth daily.  Dispense: 30 tablet; Refill:  5  3. Hyperlipidemia with target LDL less than 100 Low fat diet - NMR, lipoprofile - atorvastatin (LIPITOR) 20 MG tablet; Take 1 tablet (20 mg total) by mouth daily.  Dispense: 30 tablet; Refill: 5  4. Midline low back pain without sciatica mobic as needed  5. Insomnia Bedtime ritual - traZODone (DESYREL) 50 MG tablet; Take 0.5-1 tablets (25-50 mg total) by mouth at bedtime as needed for sleep.  Dispense: 30 tablet; Refill: 3  6. Hereditary and idiopathic peripheral neuropathy Continue neurtin as rx  7. Encounter for routine gynecological examination - Pap IG (Image Guided)  8. Screening for osteoporosis - DG Bone Density; Future  9. Peripheral neuropathy - gabapentin (NEURONTIN) 300 MG capsule; Take 1 capsule (300 mg total) by mouth 3 (three) times daily.  Dispense: 270 capsule; Refill: 1   pneumo and lfu immunizations today Labs pending Health maintenance reviewed Diet and exercise encouraged Continue all meds Follow up  In 3 months   Mary-Margaret Martin, FNP    

## 2014-06-01 NOTE — Patient Instructions (Signed)

## 2014-06-02 ENCOUNTER — Telehealth: Payer: Self-pay | Admitting: *Deleted

## 2014-06-02 LAB — CMP14+EGFR
ALBUMIN: 4.3 g/dL (ref 3.6–4.8)
ALT: 20 IU/L (ref 0–32)
AST: 29 IU/L (ref 0–40)
Albumin/Globulin Ratio: 1.9 (ref 1.1–2.5)
Alkaline Phosphatase: 61 IU/L (ref 39–117)
BUN/Creatinine Ratio: 11 (ref 11–26)
BUN: 8 mg/dL (ref 8–27)
CHLORIDE: 99 mmol/L (ref 97–108)
CO2: 28 mmol/L (ref 18–29)
Calcium: 9.5 mg/dL (ref 8.7–10.3)
Creatinine, Ser: 0.73 mg/dL (ref 0.57–1.00)
GFR calc Af Amer: 99 mL/min/{1.73_m2} (ref >59)
GFR calc non Af Amer: 86 mL/min/{1.73_m2} (ref >59)
Globulin, Total: 2.3 g/dL (ref 1.5–4.5)
Glucose: 82 mg/dL (ref 65–99)
Potassium: 4.1 mmol/L (ref 3.5–5.2)
Sodium: 139 mmol/L (ref 134–144)
Total Bilirubin: 1.3 mg/dL — ABNORMAL HIGH (ref 0.0–1.2)
Total Protein: 6.6 g/dL (ref 6.0–8.5)

## 2014-06-02 LAB — NMR, LIPOPROFILE
CHOLESTEROL: 174 mg/dL (ref 100–199)
HDL CHOLESTEROL BY NMR: 96 mg/dL (ref >39)
HDL Particle Number: 40.4 umol/L (ref >=30.5)
LDL PARTICLE NUMBER: 691 nmol/L (ref <1000)
LDL Size: 21.3 nm (ref >20.5)
LDL-C: 67 mg/dL (ref 0–99)
LP-IR Score: <25 (ref <=45)
Small LDL Particle Number: <90 nmol/L (ref <=527)
Triglycerides by NMR: 54 mg/dL (ref 0–149)

## 2014-06-02 LAB — THYROID PANEL WITH TSH
Free Thyroxine Index: 2.3 (ref 1.2–4.9)
T3 Uptake Ratio: 27 % (ref 24–39)
T4, Total: 8.5 ug/dL (ref 4.5–12.0)
TSH: 1.91 u[IU]/mL (ref 0.450–4.500)

## 2014-06-02 NOTE — Telephone Encounter (Signed)
Aware,labs look fine.

## 2014-06-03 LAB — PAP IG (IMAGE GUIDED): PAP SMEAR COMMENT: 0

## 2014-06-09 ENCOUNTER — Ambulatory Visit (INDEPENDENT_AMBULATORY_CARE_PROVIDER_SITE_OTHER): Payer: Medicare Other

## 2014-06-09 ENCOUNTER — Encounter: Payer: Self-pay | Admitting: Pharmacist

## 2014-06-09 ENCOUNTER — Ambulatory Visit (INDEPENDENT_AMBULATORY_CARE_PROVIDER_SITE_OTHER): Payer: Medicare Other | Admitting: Pharmacist

## 2014-06-09 ENCOUNTER — Telehealth: Payer: Self-pay | Admitting: Pharmacist

## 2014-06-09 VITALS — Ht 60.0 in | Wt 113.0 lb

## 2014-06-09 DIAGNOSIS — Z13820 Encounter for screening for osteoporosis: Secondary | ICD-10-CM

## 2014-06-09 DIAGNOSIS — M810 Age-related osteoporosis without current pathological fracture: Secondary | ICD-10-CM

## 2014-06-09 LAB — HM DEXA SCAN

## 2014-06-09 MED ORDER — RISEDRONATE SODIUM 150 MG PO TABS: 150.0000 mg | ORAL_TABLET | ORAL | Status: DC

## 2014-06-09 NOTE — Progress Notes (Signed)
Patient ID: Denzil Magnuson, female   DOB: 07-May-1946, 68 y.o.   MRN: [id number]  [facility] Current Height: Height: 5' (152.4 cm)      Max Lifetime Height:  5\' 0"  Current Weight: Weight: 113 lb (51.256 kg)       Ethnicity:Caucasian    HPI: Does pt already have a diagnosis of:  Osteopenia?  Yes Osteoporosis?  No  Back Pain?  Yes       Kyphosis?  No Prior fracture?  No Med(s) for Osteoporosis/Osteopenia:  none Med(s) previously tried for Osteoporosis/Osteopenia:  Fosamax - stopped because worsened GERD.  Tried evista for 28 days but stopped due to cost                                                             PMH: Age at menopause:  68 yo Hysterectomy?  No Oophorectomy?  No HRT? Yes - Former.  Type/duration: prepro (12 years) Steroid Use?  No Thyroid med?  No History of cancer?  No History of digestive disorders (ie Crohn's)?  No Current or previous eating disorders?  No Last Vitamin D Result:  46 (01/15/2012) Last GFR Result:  86 (06/01/2014)   FH/SH: Family history of osteoporosis?  Yes - sister Parent with history of hip fracture?  No Family history of breast cancer?  Yes - sister Exercise?  Yes   Smoking?  No Alcohol?  Yes - 1-2 times per week    Calcium Assessment Calcium Intake  # of servings/day  Calcium mg  Milk (8 oz) 1  x  300  = 300mg   Yogurt (4 oz) 0 x  200 = 0  Cheese (1 oz) 1 x  200 = 200mg   Other Calcium sources   250mg   Ca supplement MVI = 400mg    Estimated calcium intake per day 1150mg     DEXA Results Date of Test T-Score for AP Spine L1-L4 T-Score for Total Left Hip T-Score for Total Right Hip  06/09/2014 -2.0 -3.0 -3.0  01/03/2011 -1.7 -2.2 -2.4  02/24/2007 -1.0 -1.9 -1.9        Assessment: Osteoporosis with worsening BMD and history of intolerance to alendronate  Recommendations: 1.  Discussed treatment options (Reclast, Prolia and oral bisphosphonates).  After discussion decided to start  risedronate (ACTONEL) 150mg  1 tablet  monthly - rx sent to pharmacy and patient educated about proper administration (empty stomach, full glass of water, nothing to eat or drink for 60 minutes) and side effects to monitor for.  2.  recommend calcium 1200mg  daily through supplementation or diet.  3.  recommend weight bearing exercise - 30 minutes at least 4 days per week.   4.  Counseled and educated about fall risk and prevention.  Recheck DEXA:  2 years  Time spent counseling patient:  30 minutes   Cherre Robins, PharmD, CPP

## 2014-06-09 NOTE — Patient Instructions (Signed)
Doing an Pharmacist, community for Prolia - medication for bones - given eery 6 months.  Try Free Yoga Session - [place] - 3rd Friday of each month at 10 am or 6:30pm [phone] - Tabitha Southard                Exercise for Strong Bones  Exercise is important to build and maintain strong bones / bone density.  There are 2 types of exercises that are important to building and maintaining strong bones:  Weight- bearing and muscle-stregthening.  Weight-bearing Exercises  These exercises include activities that make you move against gravity while staying upright. Weight-bearing exercises can be high-impact or low-impact.  High-impact weight-bearing exercises help build bones and keep them strong. If you have broken a bone due to osteoporosis or are at risk of breaking a bone, you may need to avoid high-impact exercises. If you're not sure, you should check with your healthcare provider.  Examples of high-impact weight-bearing exercises are: Dancing  Doing high-impact aerobics  Hiking  Jogging/running  Jumping Rope  Stair climbing  Tennis  Low-impact weight-bearing exercises can also help keep bones strong and are a safe alternative if you cannot do high-impact exercises.   Examples of low-impact weight-bearing exercises are: Using elliptical training machines  Doing low-impact aerobics  Using stair-step machines  Fast walking on a treadmill or outside   Muscle-Strengthening Exercises These exercises include activities where you move your body, a weight or some other resistance against gravity. They are also known as resistance exercises and include: Lifting weights  Using elastic exercise bands  Using weight machines  Lifting your own body weight  Functional movements, such as standing and rising up on your toes  Yoga and Pilates can also improve strength, balance and flexibility. However, certain positions may not be safe for people with osteoporosis or  those at increased risk of broken bones. For example, exercises that have you bend forward may increase the chance of breaking a bone in the spine.   Non-Impact Exercises There are other types of exercises that can help prevent falls.  Non-impact exercises can help you to improve balance, posture and how well you move in everyday activities. Some of these exercises include: Balance exercises that strengthen your legs and test your balance, such as Tai Chi, can decrease your risk of falls.  Posture exercises that improve your posture and reduce rounded or "sloping" shoulders can help you decrease the chance of breaking a bone, especially in the spine.  Functional exercises that improve how well you move can help you with everyday activities and decrease your chance of falling and breaking a bone. For example, if you have trouble getting up from a chair or climbing stairs, you should do these activities as exercises.   **A physical therapist can teach you balance, posture and functional exercises. He/she can also help you learn which exercises are safe and appropriate for you.  [facility] has a physical therapy office in [place] in front of our office and referrals can be made for assessments and treatment as needed and strength and balance training.  If you would like to have an assessment with [facility] and our physical therapy team please let a nurse or provider know.      Fall Prevention and Home Safety Falls cause injuries and can affect all age groups. It is possible to use preventive measures to significantly decrease the likelihood of falls. There are many simple measures which can make your  home safer and prevent falls. OUTDOORS  Repair cracks and edges of walkways and driveways.  Remove high doorway thresholds.  Trim shrubbery on the main path into your home.  Have good outside lighting.  Clear walkways of tools, rocks, debris, and clutter.  Check that handrails are not broken and  are securely fastened. Both sides of steps should have handrails.  Have leaves, snow, and ice cleared regularly.  Use sand or salt on walkways during winter months.  In the garage, clean up grease or oil spills. BATHROOM  Install night lights.  Install grab bars by the toilet and in the tub and shower.  Use non-skid mats or decals in the tub or shower.  Place a plastic non-slip stool in the shower to sit on, if needed.  Keep floors dry and clean up all water on the floor immediately.  Remove soap buildup in the tub or shower on a regular basis.  Secure bath mats with non-slip, double-sided rug tape.  Remove throw rugs and tripping hazards from the floors. BEDROOMS  Install night lights.  Make sure a bedside light is easy to reach.  Do not use oversized bedding.  Keep a telephone by your bedside.  Have a firm chair with side arms to use for getting dressed.  Remove throw rugs and tripping hazards from the floor. KITCHEN  Keep handles on pots and pans turned toward the center of the stove. Use back burners when possible.  Clean up spills quickly and allow time for drying.  Avoid walking on wet floors.  Avoid hot utensils and knives.  Position shelves so they are not too high or low.  Place commonly used objects within easy reach.  If necessary, use a sturdy step stool with a grab bar when reaching.  Keep electrical cables out of the way.  Do not use floor polish or wax that makes floors slippery. If you must use wax, use non-skid floor wax.  Remove throw rugs and tripping hazards from the floor. STAIRWAYS  Never leave objects on stairs.  Place handrails on both sides of stairways and use them. Fix any loose handrails. Make sure handrails on both sides of the stairways are as long as the stairs.  Check carpeting to make sure it is firmly attached along stairs. Make repairs to worn or loose carpet promptly.  Avoid placing throw rugs at the top or bottom  of stairways, or properly secure the rug with carpet tape to prevent slippage. Get rid of throw rugs, if possible.  Have an electrician put in a light switch at the top and bottom of the stairs. OTHER FALL PREVENTION TIPS  Wear low-heel or rubber-soled shoes that are supportive and fit well. Wear closed toe shoes.  When using a stepladder, make sure it is fully opened and both spreaders are firmly locked. Do not climb a closed stepladder.  Add color or contrast paint or tape to grab bars and handrails in your home. Place contrasting color strips on first and last steps.  Learn and use mobility aids as needed. Install an electrical emergency response system.  Turn on lights to avoid dark areas. Replace light bulbs that burn out immediately. Get light switches that glow.  Arrange furniture to create clear pathways. Keep furniture in the same place.  Firmly attach carpet with non-skid or double-sided tape.  Eliminate uneven floor surfaces.  Select a carpet pattern that does not visually hide the edge of steps.  Be aware of all pets. OTHER HOME  SAFETY TIPS  Set the water temperature for 120 F (48.8 C).  Keep emergency numbers on or near the telephone.  Keep smoke detectors on every level of the home and near sleeping areas. Document Released: 06/29/2002 Document Revised: 01/08/2012 Document Reviewed: 09/28/2011 [facility] Patient Information 2015 [place], [place]. This information is not intended to replace advice given to you by your health care provider. Make sure you discuss any questions you have with your health care provider.

## 2014-06-09 NOTE — Telephone Encounter (Signed)
Encounter opened in error

## 2014-06-23 ENCOUNTER — Telehealth: Payer: Self-pay | Admitting: Nurse Practitioner

## 2014-06-26 MED ORDER — TEMAZEPAM 15 MG PO CAPS: 15.0000 mg | ORAL_CAPSULE | Freq: Every evening | ORAL | Status: DC | PRN

## 2014-06-26 NOTE — Telephone Encounter (Signed)
Please call in restoril 15mg  1 po Qhs with 0 refills

## 2014-06-28 NOTE — Telephone Encounter (Signed)
Patient aware, script for restoril called to [facility].

## 2014-07-09 ENCOUNTER — Ambulatory Visit: Payer: Self-pay

## 2014-09-03 ENCOUNTER — Other Ambulatory Visit: Payer: Self-pay | Admitting: Nurse Practitioner

## 2014-09-03 NOTE — Telephone Encounter (Signed)
rx called into pharmacy

## 2014-09-03 NOTE — Telephone Encounter (Signed)
Please call in temazepam with 1 refills 

## 2014-09-03 NOTE — Telephone Encounter (Signed)
Last filled 06/26/14, last seen 06/01/14. Call into [facility]

## 2014-11-16 ENCOUNTER — Encounter: Payer: Self-pay | Admitting: Nurse Practitioner

## 2014-11-16 ENCOUNTER — Ambulatory Visit (INDEPENDENT_AMBULATORY_CARE_PROVIDER_SITE_OTHER): Payer: Medicare Other | Admitting: Nurse Practitioner

## 2014-11-16 VITALS — BP 123/74 | HR 89 | Temp 97.9°F | Ht 60.0 in | Wt 114.0 lb

## 2014-11-16 DIAGNOSIS — J0100 Acute maxillary sinusitis, unspecified: Secondary | ICD-10-CM

## 2014-11-16 MED ORDER — AZITHROMYCIN 250 MG PO TABS: ORAL_TABLET | ORAL | Status: DC

## 2014-11-16 NOTE — Patient Instructions (Signed)

## 2014-11-16 NOTE — Progress Notes (Signed)
  Subjective:     Yvonne RIVERE is a 69 y.o. female who presents for evaluation of sinus pain. Symptoms include: congestion, cough, headaches, nasal congestion, sinus pressure and sore throat. Onset of symptoms was 10 days ago. Symptoms have been unchanged since that time. Past history is significant for no history of pneumonia or bronchitis. Patient is a non-smoker.  The following portions of the patient's history were reviewed and updated as appropriate: allergies, current medications, past family history, past medical history, past social history, past surgical history and problem list.  Review of Systems Pertinent items are noted in HPI.   Objective:    BP 123/74 mmHg  Pulse 89  Temp(Src) 97.9 F (36.6 C) (Oral)  Ht 5' (1.524 m)  Wt 114 lb (51.71 kg)  BMI 22.26 kg/m2 General appearance: alert and cooperative Eyes: conjunctivae/corneas clear. PERRL, EOM's intact. Fundi benign. Ears: normal TM's and external ear canals both ears Nose: mild congestion, turbinates red, no sinus tenderness Throat: lips, mucosa, and tongue normal; teeth and gums normal Lungs: clear to auscultation bilaterally dry cough Heart: regular rate and rhythm, S1, S2 normal, no murmur, click, rub or gallop    Assessment:    Acute bacterial sinusitis.    Plan:  1. Take meds as prescribed 2. Use a cool mist humidifier especially during the winter months and when heat has been humid. 3. Use saline nose sprays frequently 4. Saline irrigations of the nose can be very helpful if done frequently.  * 4X daily for 1 week*  * Use of a nettie pot can be helpful with this. Follow directions with this* 5. Drink plenty of fluids 6. Keep thermostat turn down low 7.For any cough or congestion  Use plain Mucinex- regular strength or max strength is fine   * Children- consult with Pharmacist for dosing 8. For fever or aces or pains- take tylenol or ibuprofen appropriate for age and weight.  * for fevers greater than  101 orally you may alternate ibuprofen and tylenol every  3 hours.    Meds ordered this encounter  Medications  . azithromycin (ZITHROMAX) 250 MG tablet    Sig: Two tablets day one, then one tablet daily next 4 days.    Dispense:  6 tablet    Refill:  0    Order Specific Question:  Supervising Provider    Answer:  Chipper Herb [[id number]]   Mary-Margaret Hassell Done, FNP

## 2014-11-24 ENCOUNTER — Encounter: Payer: Self-pay | Admitting: *Deleted

## 2014-11-24 ENCOUNTER — Ambulatory Visit (INDEPENDENT_AMBULATORY_CARE_PROVIDER_SITE_OTHER): Payer: Medicare Other | Admitting: *Deleted

## 2014-11-24 VITALS — BP 99/66 | HR 69 | Ht 60.0 in | Wt 114.0 lb

## 2014-11-24 DIAGNOSIS — Z1211 Encounter for screening for malignant neoplasm of colon: Secondary | ICD-10-CM

## 2014-11-24 DIAGNOSIS — Z0000 Encounter for general adult medical examination without abnormal findings: Secondary | ICD-10-CM | POA: Diagnosis not present

## 2014-11-24 NOTE — Patient Instructions (Signed)
You have been scheduled for a mammogram on May 25, at 1:45pm at the [facility] mobile unit here at [place]  We are referring you for a colonoscopy   The information on advanced directives can be found at [email or web address]  Thank you for coming in for your annual wellness visit today!  Preventive Care for Adults A healthy lifestyle and preventive care can promote health and wellness. Preventive health guidelines for women include the following key practices.  A routine yearly physical is a good way to check with your health care provider about your health and preventive screening. It is a chance to share any concerns and updates on your health and to receive a thorough exam.  Visit your dentist for a routine exam and preventive care every 6 months. Brush your teeth twice a day and floss once a day. Good oral hygiene prevents tooth decay and gum disease.  The frequency of eye exams is based on your age, health, family medical history, use of contact lenses, and other factors. Follow your health care provider's recommendations for frequency of eye exams.  Eat a healthy diet. Foods like vegetables, fruits, whole grains, low-fat dairy products, and lean protein foods contain the nutrients you need without too many calories. Decrease your intake of foods high in solid fats, added sugars, and salt. Eat the right amount of calories for you.Get information about a proper diet from your health care provider, if necessary.  Regular physical exercise is one of the most important things you can do for your health. Most adults should get at least 150 minutes of moderate-intensity exercise (any activity that increases your heart rate and causes you to sweat) each week. In addition, most adults need muscle-strengthening exercises on 2 or more days a week.  Maintain a healthy weight. The body mass index (BMI) is a screening tool to identify possible weight problems. It provides an  estimate of body fat based on height and weight. Your health care provider can find your BMI and can help you achieve or maintain a healthy weight.For adults 20 years and older:  A BMI below 18.5 is considered underweight.  A BMI of 18.5 to 24.9 is normal.  A BMI of 25 to 29.9 is considered overweight.  A BMI of 30 and above is considered obese.  Maintain normal blood lipids and cholesterol levels by exercising and minimizing your intake of saturated fat. Eat a balanced diet with plenty of fruit and vegetables. Blood tests for lipids and cholesterol should begin at age 37 and be repeated every 5 years. If your lipid or cholesterol levels are high, you are over 50, or you are at high risk for heart disease, you may need your cholesterol levels checked more frequently.Ongoing high lipid and cholesterol levels should be treated with medicines if diet and exercise are not working.  If you smoke, find out from your health care provider how to quit. If you do not use tobacco, do not start.  Lung cancer screening is recommended for adults aged 44-80 years who are at high risk for developing lung cancer because of a history of smoking. A yearly low-dose CT scan of the lungs is recommended for people who have at least a 30-pack-year history of smoking and are a current smoker or have quit within the past 15 years. A pack year of smoking is smoking an average of 1 pack of cigarettes a day for 1 year (for example: 1 pack a day for 30 years or  2 packs a day for 15 years). Yearly screening should continue until the smoker has stopped smoking for at least 15 years. Yearly screening should be stopped for people who develop a health problem that would prevent them from having lung cancer treatment.  If you are pregnant, do not drink alcohol. If you are breastfeeding, be very cautious about drinking alcohol. If you are not pregnant and choose to drink alcohol, do not have more than 1 drink per day. One drink is  considered to be 12 ounces (355 mL) of beer, 5 ounces (148 mL) of wine, or 1.5 ounces (44 mL) of liquor.  Avoid use of street drugs. Do not share needles with anyone. Ask for help if you need support or instructions about stopping the use of drugs.  High blood pressure causes heart disease and increases the risk of stroke. Your blood pressure should be checked at least every 1 to 2 years. Ongoing high blood pressure should be treated with medicines if weight loss and exercise do not work.  If you are 9-74 years old, ask your health care provider if you should take aspirin to prevent strokes.  Diabetes screening involves taking a blood sample to check your fasting blood sugar level. This should be done once every 3 years, after age 109, if you are within normal weight and without risk factors for diabetes. Testing should be considered at a younger age or be carried out more frequently if you are overweight and have at least 1 risk factor for diabetes.  Breast cancer screening is essential preventive care for women. You should practice "breast self-awareness." This means understanding the normal appearance and feel of your breasts and may include breast self-examination. Any changes detected, no matter how small, should be reported to a health care provider. Women in their 2s and 30s should have a clinical breast exam (CBE) by a health care provider as part of a regular health exam every 1 to 3 years. After age 54, women should have a CBE every year. Starting at age 94, women should consider having a mammogram (breast X-ray test) every year. Women who have a family history of breast cancer should talk to their health care provider about genetic screening. Women at a high risk of breast cancer should talk to their health care providers about having an MRI and a mammogram every year.  Breast cancer gene (BRCA)-related cancer risk assessment is recommended for women who have family members with BRCA-related  cancers. BRCA-related cancers include breast, ovarian, tubal, and peritoneal cancers. Having family members with these cancers may be associated with an increased risk for harmful changes (mutations) in the breast cancer genes BRCA1 and BRCA2. Results of the assessment will determine the need for genetic counseling and BRCA1 and BRCA2 testing.  Routine pelvic exams to screen for cancer are no longer recommended for nonpregnant women who are considered low risk for cancer of the pelvic organs (ovaries, uterus, and vagina) and who do not have symptoms. Ask your health care provider if a screening pelvic exam is right for you.  If you have had past treatment for cervical cancer or a condition that could lead to cancer, you need Pap tests and screening for cancer for at least 20 years after your treatment. If Pap tests have been discontinued, your risk factors (such as having a new sexual partner) need to be reassessed to determine if screening should be resumed. Some women have medical problems that increase the chance of getting cervical cancer.  In these cases, your health care provider may recommend more frequent screening and Pap tests.  The HPV test is an additional test that may be used for cervical cancer screening. The HPV test looks for the virus that can cause the cell changes on the cervix. The cells collected during the Pap test can be tested for HPV. The HPV test could be used to screen women aged 69 years and older, and should be used in women of any age who have unclear Pap test results. After the age of 63, women should have HPV testing at the same frequency as a Pap test.  Colorectal cancer can be detected and often prevented. Most routine colorectal cancer screening begins at the age of 75 years and continues through age 39 years. However, your health care provider may recommend screening at an earlier age if you have risk factors for colon cancer. On a yearly basis, your health care provider  may provide home test kits to check for hidden blood in the stool. Use of a small camera at the end of a tube, to directly examine the colon (sigmoidoscopy or colonoscopy), can detect the earliest forms of colorectal cancer. Talk to your health care provider about this at age 61, when routine screening begins. Direct exam of the colon should be repeated every 5-10 years through age 73 years, unless early forms of pre-cancerous polyps or small growths are found.  People who are at an increased risk for hepatitis B should be screened for this virus. You are considered at high risk for hepatitis B if:  You were born in a country where hepatitis B occurs often. Talk with your health care provider about which countries are considered high risk.  Your parents were born in a high-risk country and you have not received a shot to protect against hepatitis B (hepatitis B vaccine).  You have HIV or AIDS.  You use needles to inject street drugs.  You live with, or have sex with, someone who has hepatitis B.  You get hemodialysis treatment.  You take certain medicines for conditions like cancer, organ transplantation, and autoimmune conditions.  Hepatitis C blood testing is recommended for all people born from [birth year] through 1965 and any individual with known risks for hepatitis C.  Practice safe sex. Use condoms and avoid high-risk sexual practices to reduce the spread of sexually transmitted infections (STIs). STIs include gonorrhea, chlamydia, syphilis, trichomonas, herpes, HPV, and human immunodeficiency virus (HIV). Herpes, HIV, and HPV are viral illnesses that have no cure. They can result in disability, cancer, and death.  You should be screened for sexually transmitted illnesses (STIs) including gonorrhea and chlamydia if:  You are sexually active and are younger than 24 years.  You are older than 24 years and your health care provider tells you that you are at risk for this type of  infection.  Your sexual activity has changed since you were last screened and you are at an increased risk for chlamydia or gonorrhea. Ask your health care provider if you are at risk.  If you are at risk of being infected with HIV, it is recommended that you take a prescription medicine daily to prevent HIV infection. This is called preexposure prophylaxis (PrEP). You are considered at risk if:  You are a heterosexual woman, are sexually active, and are at increased risk for HIV infection.  You take drugs by injection.  You are sexually active with a partner who has HIV.  Talk with your health  care provider about whether you are at high risk of being infected with HIV. If you choose to begin PrEP, you should first be tested for HIV. You should then be tested every 3 months for as long as you are taking PrEP.  Osteoporosis is a disease in which the bones lose minerals and strength with aging. This can result in serious bone fractures or breaks. The risk of osteoporosis can be identified using a bone density scan. Women ages 61 years and over and women at risk for fractures or osteoporosis should discuss screening with their health care providers. Ask your health care provider whether you should take a calcium supplement or vitamin D to reduce the rate of osteoporosis.  Menopause can be associated with physical symptoms and risks. Hormone replacement therapy is available to decrease symptoms and risks. You should talk to your health care provider about whether hormone replacement therapy is right for you.  Use sunscreen. Apply sunscreen liberally and repeatedly throughout the day. You should seek shade when your shadow is shorter than you. Protect yourself by wearing long sleeves, pants, a wide-brimmed hat, and sunglasses year round, whenever you are outdoors.  Once a month, do a whole body skin exam, using a mirror to look at the skin on your back. Tell your health care provider of new moles,  moles that have irregular borders, moles that are larger than a pencil eraser, or moles that have changed in shape or color.  Stay current with required vaccines (immunizations).  Influenza vaccine. All adults should be immunized every year.  Tetanus, diphtheria, and acellular pertussis (Td, Tdap) vaccine. Pregnant women should receive 1 dose of Tdap vaccine during each pregnancy. The dose should be obtained regardless of the length of time since the last dose. Immunization is preferred during the 27th-36th week of gestation. An adult who has not previously received Tdap or who does not know her vaccine status should receive 1 dose of Tdap. This initial dose should be followed by tetanus and diphtheria toxoids (Td) booster doses every 10 years. Adults with an unknown or incomplete history of completing a 3-dose immunization series with Td-containing vaccines should begin or complete a primary immunization series including a Tdap dose. Adults should receive a Td booster every 10 years.  Varicella vaccine. An adult without evidence of immunity to varicella should receive 2 doses or a second dose if she has previously received 1 dose. Pregnant females who do not have evidence of immunity should receive the first dose after pregnancy. This first dose should be obtained before leaving the health care facility. The second dose should be obtained 4-8 weeks after the first dose.  Human papillomavirus (HPV) vaccine. Females aged 13-26 years who have not received the vaccine previously should obtain the 3-dose series. The vaccine is not recommended for use in pregnant females. However, pregnancy testing is not needed before receiving a dose. If a female is found to be pregnant after receiving a dose, no treatment is needed. In that case, the remaining doses should be delayed until after the pregnancy. Immunization is recommended for any person with an immunocompromised condition through the age of 46 years if she  did not get any or all doses earlier. During the 3-dose series, the second dose should be obtained 4-8 weeks after the first dose. The third dose should be obtained 24 weeks after the first dose and 16 weeks after the second dose.  Zoster vaccine. One dose is recommended for adults aged 46 years or  older unless certain conditions are present.  Measles, mumps, and rubella (MMR) vaccine. Adults born before [birth year] generally are considered immune to measles and mumps. Adults born in [birth year] or later should have 1 or more doses of MMR vaccine unless there is a contraindication to the vaccine or there is laboratory evidence of immunity to each of the three diseases. A routine second dose of MMR vaccine should be obtained at least 28 days after the first dose for students attending postsecondary schools, health care workers, or international travelers. People who received inactivated measles vaccine or an unknown type of measles vaccine during 1963-1967 should receive 2 doses of MMR vaccine. People who received inactivated mumps vaccine or an unknown type of mumps vaccine before 1979 and are at high risk for mumps infection should consider immunization with 2 doses of MMR vaccine. For females of childbearing age, rubella immunity should be determined. If there is no evidence of immunity, females who are not pregnant should be vaccinated. If there is no evidence of immunity, females who are pregnant should delay immunization until after pregnancy. Unvaccinated health care workers born before [birth year] who lack laboratory evidence of measles, mumps, or rubella immunity or laboratory confirmation of disease should consider measles and mumps immunization with 2 doses of MMR vaccine or rubella immunization with 1 dose of MMR vaccine.  Pneumococcal 13-valent conjugate (PCV13) vaccine. When indicated, a person who is uncertain of her immunization history and has no record of immunization should receive the PCV13 vaccine. An adult  aged 53 years or older who has certain medical conditions and has not been previously immunized should receive 1 dose of PCV13 vaccine. This PCV13 should be followed with a dose of pneumococcal polysaccharide (PPSV23) vaccine. The PPSV23 vaccine dose should be obtained at least 8 weeks after the dose of PCV13 vaccine. An adult aged 49 years or older who has certain medical conditions and previously received 1 or more doses of PPSV23 vaccine should receive 1 dose of PCV13. The PCV13 vaccine dose should be obtained 1 or more years after the last PPSV23 vaccine dose.  Pneumococcal polysaccharide (PPSV23) vaccine. When PCV13 is also indicated, PCV13 should be obtained first. All adults aged 24 years and older should be immunized. An adult younger than age 36 years who has certain medical conditions should be immunized. Any person who resides in a nursing home or long-term care facility should be immunized. An adult smoker should be immunized. People with an immunocompromised condition and certain other conditions should receive both PCV13 and PPSV23 vaccines. People with human immunodeficiency virus (HIV) infection should be immunized as soon as possible after diagnosis. Immunization during chemotherapy or radiation therapy should be avoided. Routine use of PPSV23 vaccine is not recommended for American Indians, [place] Natives, or people younger than 65 years unless there are medical conditions that require PPSV23 vaccine. When indicated, people who have unknown immunization and have no record of immunization should receive PPSV23 vaccine. One-time revaccination 5 years after the first dose of PPSV23 is recommended for people aged 19-64 years who have chronic kidney failure, nephrotic syndrome, asplenia, or immunocompromised conditions. People who received 1-2 doses of PPSV23 before age 63 years should receive another dose of PPSV23 vaccine at age 42 years or later if at least 5 years have passed since the previous  dose. Doses of PPSV23 are not needed for people immunized with PPSV23 at or after age 54 years.  Meningococcal vaccine. Adults with asplenia or persistent complement component deficiencies should receive 2  doses of quadrivalent meningococcal conjugate (MenACWY-D) vaccine. The doses should be obtained at least 2 months apart. Microbiologists working with certain meningococcal bacteria, [employer] recruits, people at risk during an outbreak, and people who travel to or live in countries with a high rate of meningitis should be immunized. A first-year college student up through age 13 years who is living in a residence hall should receive a dose if she did not receive a dose on or after her 16th birthday. Adults who have certain high-risk conditions should receive one or more doses of vaccine.  Hepatitis A vaccine. Adults who wish to be protected from this disease, have certain high-risk conditions, work with hepatitis A-infected animals, work in hepatitis A research labs, or travel to or work in countries with a high rate of hepatitis A should be immunized. Adults who were previously unvaccinated and who anticipate close contact with an international adoptee during the first 60 days after arrival in the [place] from a country with a high rate of hepatitis A should be immunized.  Hepatitis B vaccine. Adults who wish to be protected from this disease, have certain high-risk conditions, may be exposed to blood or other infectious body fluids, are household contacts or sex partners of hepatitis B positive people, are clients or workers in certain care facilities, or travel to or work in countries with a high rate of hepatitis B should be immunized.  Haemophilus influenzae type b (Hib) vaccine. A previously unvaccinated person with asplenia or sickle cell disease or having a scheduled splenectomy should receive 1 dose of Hib vaccine. Regardless of previous immunization, a recipient of a hematopoietic stem cell  transplant should receive a 3-dose series 6-12 months after her successful transplant. Hib vaccine is not recommended for adults with HIV infection. Preventive Services / Frequency Ages 9 to 27 years  Blood pressure check.** / Every 1 to 2 years.  Lipid and cholesterol check.** / Every 5 years beginning at age 31.  Clinical breast exam.** / Every 3 years for women in their 38s and 64s.  BRCA-related cancer risk assessment.** / For women who have family members with a BRCA-related cancer (breast, ovarian, tubal, or peritoneal cancers).  Pap test.** / Every 2 years from ages 73 through 73. Every 3 years starting at age 52 through age 37 or 69 with a history of 3 consecutive normal Pap tests.  HPV screening.** / Every 3 years from ages 40 through ages 52 to 3 with a history of 3 consecutive normal Pap tests.  Hepatitis C blood test.** / For any individual with known risks for hepatitis C.  Skin self-exam. / Monthly.  Influenza vaccine. / Every year.  Tetanus, diphtheria, and acellular pertussis (Tdap, Td) vaccine.** / Consult your health care provider. Pregnant women should receive 1 dose of Tdap vaccine during each pregnancy. 1 dose of Td every 10 years.  Varicella vaccine.** / Consult your health care provider. Pregnant females who do not have evidence of immunity should receive the first dose after pregnancy.  HPV vaccine. / 3 doses over 6 months, if 16 and younger. The vaccine is not recommended for use in pregnant females. However, pregnancy testing is not needed before receiving a dose.  Measles, mumps, rubella (MMR) vaccine.** / You need at least 1 dose of MMR if you were born in 1957 or later. You may also need a 2nd dose. For females of childbearing age, rubella immunity should be determined. If there is no evidence of immunity, females who are not  pregnant should be vaccinated. If there is no evidence of immunity, females who are pregnant should delay immunization until after  pregnancy.  Pneumococcal 13-valent conjugate (PCV13) vaccine.** / Consult your health care provider.  Pneumococcal polysaccharide (PPSV23) vaccine.** / 1 to 2 doses if you smoke cigarettes or if you have certain conditions.  Meningococcal vaccine.** / 1 dose if you are age 59 to 3 years and a Market researcher living in a residence hall, or have one of several medical conditions, you need to get vaccinated against meningococcal disease. You may also need additional booster doses.  Hepatitis A vaccine.** / Consult your health care provider.  Hepatitis B vaccine.** / Consult your health care provider.  Haemophilus influenzae type b (Hib) vaccine.** / Consult your health care provider. Ages 6 to 25 years  Blood pressure check.** / Every 1 to 2 years.  Lipid and cholesterol check.** / Every 5 years beginning at age 20 years.  Lung cancer screening. / Every year if you are aged 21-80 years and have a 30-pack-year history of smoking and currently smoke or have quit within the past 15 years. Yearly screening is stopped once you have quit smoking for at least 15 years or develop a health problem that would prevent you from having lung cancer treatment.  Clinical breast exam.** / Every year after age 61 years.  BRCA-related cancer risk assessment.** / For women who have family members with a BRCA-related cancer (breast, ovarian, tubal, or peritoneal cancers).  Mammogram.** / Every year beginning at age 57 years and continuing for as long as you are in good health. Consult with your health care provider.  Pap test.** / Every 3 years starting at age 40 years through age 12 or 61 years with a history of 3 consecutive normal Pap tests.  HPV screening.** / Every 3 years from ages 72 years through ages 60 to 73 years with a history of 3 consecutive normal Pap tests.  Fecal occult blood test (FOBT) of stool. / Every year beginning at age 67 years and continuing until age 38 years. You may  not need to do this test if you get a colonoscopy every 10 years.  Flexible sigmoidoscopy or colonoscopy.** / Every 5 years for a flexible sigmoidoscopy or every 10 years for a colonoscopy beginning at age 66 years and continuing until age 81 years.  Hepatitis C blood test.** / For all people born from [birth year] through 1965 and any individual with known risks for hepatitis C.  Skin self-exam. / Monthly.  Influenza vaccine. / Every year.  Tetanus, diphtheria, and acellular pertussis (Tdap/Td) vaccine.** / Consult your health care provider. Pregnant women should receive 1 dose of Tdap vaccine during each pregnancy. 1 dose of Td every 10 years.  Varicella vaccine.** / Consult your health care provider. Pregnant females who do not have evidence of immunity should receive the first dose after pregnancy.  Zoster vaccine.** / 1 dose for adults aged 1 years or older.  Measles, mumps, rubella (MMR) vaccine.** / You need at least 1 dose of MMR if you were born in 1957 or later. You may also need a 2nd dose. For females of childbearing age, rubella immunity should be determined. If there is no evidence of immunity, females who are not pregnant should be vaccinated. If there is no evidence of immunity, females who are pregnant should delay immunization until after pregnancy.  Pneumococcal 13-valent conjugate (PCV13) vaccine.** / Consult your health care provider.  Pneumococcal polysaccharide (PPSV23) vaccine.** / 1  to 2 doses if you smoke cigarettes or if you have certain conditions.  Meningococcal vaccine.** / Consult your health care provider.  Hepatitis A vaccine.** / Consult your health care provider.  Hepatitis B vaccine.** / Consult your health care provider.  Haemophilus influenzae type b (Hib) vaccine.** / Consult your health care provider. Ages 34 years and over  Blood pressure check.** / Every 1 to 2 years.  Lipid and cholesterol check.** / Every 5 years beginning at age 90 years.  Lung  cancer screening. / Every year if you are aged 60-80 years and have a 30-pack-year history of smoking and currently smoke or have quit within the past 15 years. Yearly screening is stopped once you have quit smoking for at least 15 years or develop a health problem that would prevent you from having lung cancer treatment.  Clinical breast exam.** / Every year after age 15 years.  BRCA-related cancer risk assessment.** / For women who have family members with a BRCA-related cancer (breast, ovarian, tubal, or peritoneal cancers).  Mammogram.** / Every year beginning at age 33 years and continuing for as long as you are in good health. Consult with your health care provider.  Pap test.** / Every 3 years starting at age 62 years through age 16 or 47 years with 3 consecutive normal Pap tests. Testing can be stopped between 65 and 70 years with 3 consecutive normal Pap tests and no abnormal Pap or HPV tests in the past 10 years.  HPV screening.** / Every 3 years from ages 59 years through ages 68 or 62 years with a history of 3 consecutive normal Pap tests. Testing can be stopped between 65 and 70 years with 3 consecutive normal Pap tests and no abnormal Pap or HPV tests in the past 10 years.  Fecal occult blood test (FOBT) of stool. / Every year beginning at age 33 years and continuing until age 81 years. You may not need to do this test if you get a colonoscopy every 10 years.  Flexible sigmoidoscopy or colonoscopy.** / Every 5 years for a flexible sigmoidoscopy or every 10 years for a colonoscopy beginning at age 65 years and continuing until age 64 years.  Hepatitis C blood test.** / For all people born from [birth year] through 1963 and any individual with known risks for hepatitis C.  Osteoporosis screening.** / A one-time screening for women ages 81 years and over and women at risk for fractures or osteoporosis.  Skin self-exam. / Monthly.  Influenza vaccine. / Every year.  Tetanus, diphtheria, and  acellular pertussis (Tdap/Td) vaccine.** / 1 dose of Td every 10 years.  Varicella vaccine.** / Consult your health care provider.  Zoster vaccine.** / 1 dose for adults aged 8 years or older.  Pneumococcal 13-valent conjugate (PCV13) vaccine.** / Consult your health care provider.  Pneumococcal polysaccharide (PPSV23) vaccine.** / 1 dose for all adults aged 41 years and older.  Meningococcal vaccine.** / Consult your health care provider.  Hepatitis A vaccine.** / Consult your health care provider.  Hepatitis B vaccine.** / Consult your health care provider.  Haemophilus influenzae type b (Hib) vaccine.** / Consult your health care provider. ** Family history and personal history of risk and conditions may change your health care provider's recommendations. Document Released: 09/04/2001 Document Revised: 11/23/2013 Document Reviewed: 12/04/2010 [facility] Patient Information 2015 [place], [place]. This information is not intended to replace advice given to you by your health care provider. Make sure you discuss any questions you have with your  health care provider.

## 2014-11-25 ENCOUNTER — Encounter: Payer: Self-pay | Admitting: *Deleted

## 2014-11-25 NOTE — Progress Notes (Signed)
Subjective:   Yvonne Rivers is a 69 y.o. female who presents for an Initial Medicare Annual Wellness Visit.  She is doing well today, her only complaint is mild back pain which she attributes to working out in the yard yesterday.  She has worked at [employer] in [place] for over 30 years and continues to work there on Fridays and Saturdays which she says helps her stay active.  She is married and lives with her husband.  Yvonne Rivers has 2 children, 1 grand daughter, and 2 great grandchildren.  States she had a sister that passed away recently, and was having trouble sleeping during that time.  She notes that her sleep has improved now, and she rarely has to take temazepam now.         Review of Systems      Cardiac Risk Factors include: advanced age (>63men, >55 women);family history of premature cardiovascular disease;hypertension     Objective:    Today's Vitals   11/24/14 1515  BP: 99/66  Pulse: 69  Height: 5' (1.524 m)  Weight: 114 lb (51.71 kg)  PainSc: 4   PainLoc: Back    Current Medications (verified) Outpatient Encounter Prescriptions as of 11/24/2014  Medication Sig  . atorvastatin (LIPITOR) 20 MG tablet Take 1 tablet (20 mg total) by mouth daily.  Marland Kitchen gabapentin (NEURONTIN) 300 MG capsule Take 1 capsule (300 mg total) by mouth 3 (three) times daily.  Marland Kitchen lisinopril (PRINIVIL,ZESTRIL) 40 MG tablet Take 1 tablet (40 mg total) by mouth daily.  . meloxicam (MOBIC) 15 MG tablet Take 1 tablet (15 mg total) by mouth daily.  . temazepam (RESTORIL) 15 MG capsule TAKE ONE CAPSULE BY MOUTH AT BEDTIME  . [DISCONTINUED] azithromycin (ZITHROMAX) 250 MG tablet Two tablets day one, then one tablet daily next 4 days. (Patient not taking: Reported on 11/24/2014)   No facility-administered encounter medications on file as of 11/24/2014.    Allergies (verified) Review of patient's allergies indicates no known allergies.   History: Past Medical History  Diagnosis Date  .  Hypertension   . Hyperlipidemia   . Arthritis   . Osteoporosis    Past Surgical History  Procedure Laterality Date  . Cholecystectomy    . Tubal ligation    . Cyst removed from right breast    . Rotator cuff tear in right shoulder     Family History  Problem Relation Age of Onset  . Heart disease Mother     MI at 87years  . Cancer Father     lung  . Kidney disease Sister   . Diabetes Sister   . Cancer Sister     breast cancer  . Heart disease Sister 78    CABG age 9, CHF  . COPD Sister   . Cancer Brother     lung cancer  . Alzheimer's disease Brother   . Alzheimer's disease Brother   . Heart disease Brother     CHF  . Heart disease Brother 54    CABG  . Cancer Brother 82    leukemia  . Heart disease Brother 48    MI  . Heart disease Brother 47    CABG  . Heart disease Brother     stents  . COPD Brother      Tobacco Counseling Counseling given: No   Activities of Daily Living In your present state of health, do you have any difficulty performing the following activities: 11/24/2014 06/01/2014  Hearing? N N  Vision? N N  Difficulty concentrating or making decisions? N N  Walking or climbing stairs? N N  Dressing or bathing? N N  Doing errands, shopping? N N  Preparing Food and eating ? N -  Using the Toilet? N -  In the past six months, have you accidently leaked urine? N -  Do you have problems with loss of bowel control? N -  Managing your Medications? N -  Managing your Finances? N -  Housekeeping or managing your Housekeeping? N -    Immunizations and Health Maintenance Immunization History  Administered Date(s) Administered  . Influenza,inj,Quad PF,36+ Mos 06/01/2014  . Pneumococcal Polysaccharide-23 06/01/2014   There are no preventive care reminders to display for this patient.  Patient Care Team: Chevis Pretty, FNP as PCP - General (Nurse Practitioner)       Assessment:   This is a routine wellness examination for Yvonne Rivers.    Hearing/Vision screen  Patient is seen by [facility] in [place]- seen in January and got new glasses. No history of cataracts or glaucoma. No hearing deficits noted.     Dietary issues and exercise activities discussed: Current Exercise Habits:: The patient has a physically strenous job, but has no regular exercise apart from work.   Eats a healthy diet, though she does not have as much appetite as used to.  States she has lost 30 pounds in the past 3 years.  Advised her to try including ensure in her diet daily for increased nutrient content.  Encouraged exercising at least 3 days per week, for about 30 minutes per session.    Goals    None    Increase exercise to at least 3 times per week building up to 30 minutes per session. Try adding ensure to diet due to decreased appetite     Depression Screen PHQ 2/9 Scores 11/24/2014 06/01/2014  PHQ - 2 Score 0 2  PHQ- 9 Score - 7    Fall Risk Fall Risk  11/24/2014 06/01/2014  Falls in the past year? Yes No  Number falls in past yr: 1 -  Injury with Fall? No -    Cognitive Function:  MMS:   Total 25  Screening Tests Health Maintenance  Topic Date Due  . COLONOSCOPY  11/30/2014 (Originally 04/01/1998)  . ZOSTAVAX  11/30/2014 (Originally 04/01/2008)  . INFLUENZA VACCINE  02/21/2015  . MAMMOGRAM  05/14/2015  . COLON CANCER SCREENING ANNUAL FOBT  06/02/2015  . PNA vac Low Risk Adult (2 of 2 - PCV13) 06/02/2015  . TETANUS/TDAP  06/02/2019  . DEXA SCAN  Completed      Plan:     During the course of the visit, Yvonne Rivers was educated and counseled about the following appropriate screening and preventive services:   Vaccines to include Pneumoccal, Influenza, Td, Zostavax up to date on all except zostavax - deferred zostavax today due to cost  Electrocardiogram- done 06/01/14  Cardiovascular disease screening- lipids screened 06/01/14  Colorectal cancer screening-referral made to [facility], Dr. Lavina Hamman  Bone density  screening- up to date  Diabetes screening- glucose screened   Glaucoma screening- up to date  Mammography/PAP- pap up to date, scheduled for Mammogram 12/15/14  Nutrition counseling- discussed including mostly lean meats, vegetables, and fruits in diet and adding ensure for extra nutrition   Patient Instructions (the written plan) were given to the patient.    Yvonne Rivers M, RN   11/25/2014      I have reviewed and agree  with the above AWV documentation.  Claretta Fraise, M.D.

## 2014-12-15 DIAGNOSIS — Z1231 Encounter for screening mammogram for malignant neoplasm of breast: Secondary | ICD-10-CM | POA: Diagnosis not present

## 2015-01-04 ENCOUNTER — Encounter: Payer: Self-pay | Admitting: Nurse Practitioner

## 2015-03-09 ENCOUNTER — Other Ambulatory Visit: Payer: Self-pay | Admitting: Nurse Practitioner

## 2015-03-09 MED ORDER — LISINOPRIL 40 MG PO TABS: 40.0000 mg | ORAL_TABLET | Freq: Every day | ORAL | Status: DC

## 2015-04-10 ENCOUNTER — Other Ambulatory Visit: Payer: Self-pay | Admitting: Nurse Practitioner

## 2015-04-11 NOTE — Telephone Encounter (Signed)
Last seen 11/16/14  MMM

## 2015-04-11 NOTE — Telephone Encounter (Signed)
Patient aware and will call to schedule an appointment  

## 2015-04-11 NOTE — Telephone Encounter (Signed)
Patient NTBS for follow up and lab work  

## 2015-04-17 ENCOUNTER — Other Ambulatory Visit: Payer: Self-pay | Admitting: Nurse Practitioner

## 2015-05-11 ENCOUNTER — Other Ambulatory Visit: Payer: Self-pay | Admitting: Nurse Practitioner

## 2015-05-11 NOTE — Telephone Encounter (Signed)
Patient aware that she will need to be seen  

## 2015-05-11 NOTE — Telephone Encounter (Signed)
Last seen 11/16/14  MMM

## 2015-05-11 NOTE — Telephone Encounter (Signed)
Last refill without being seen 

## 2015-05-23 DIAGNOSIS — H40033 Anatomical narrow angle, bilateral: Secondary | ICD-10-CM | POA: Diagnosis not present

## 2015-05-23 DIAGNOSIS — H2513 Age-related nuclear cataract, bilateral: Secondary | ICD-10-CM | POA: Diagnosis not present

## 2015-05-30 DIAGNOSIS — G44219 Episodic tension-type headache, not intractable: Secondary | ICD-10-CM | POA: Diagnosis not present

## 2015-06-01 ENCOUNTER — Encounter: Payer: Self-pay | Admitting: Pediatrics

## 2015-06-01 ENCOUNTER — Ambulatory Visit (INDEPENDENT_AMBULATORY_CARE_PROVIDER_SITE_OTHER): Payer: Medicare Other | Admitting: Pediatrics

## 2015-06-01 VITALS — BP 115/70 | HR 65 | Temp 97.6°F | Ht 60.0 in | Wt 114.6 lb

## 2015-06-01 DIAGNOSIS — M545 Low back pain, unspecified: Secondary | ICD-10-CM

## 2015-06-01 DIAGNOSIS — M810 Age-related osteoporosis without current pathological fracture: Secondary | ICD-10-CM

## 2015-06-01 DIAGNOSIS — F411 Generalized anxiety disorder: Secondary | ICD-10-CM | POA: Diagnosis not present

## 2015-06-01 DIAGNOSIS — E785 Hyperlipidemia, unspecified: Secondary | ICD-10-CM | POA: Diagnosis not present

## 2015-06-01 DIAGNOSIS — G4700 Insomnia, unspecified: Secondary | ICD-10-CM | POA: Diagnosis not present

## 2015-06-01 DIAGNOSIS — G609 Hereditary and idiopathic neuropathy, unspecified: Secondary | ICD-10-CM

## 2015-06-01 DIAGNOSIS — Z23 Encounter for immunization: Secondary | ICD-10-CM | POA: Diagnosis not present

## 2015-06-01 DIAGNOSIS — I10 Essential (primary) hypertension: Secondary | ICD-10-CM | POA: Diagnosis not present

## 2015-06-01 MED ORDER — CITALOPRAM HYDROBROMIDE 20 MG PO TABS: 20.0000 mg | ORAL_TABLET | Freq: Every day | ORAL | Status: DC

## 2015-06-01 MED ORDER — TEMAZEPAM 15 MG PO CAPS: ORAL_CAPSULE | ORAL | Status: DC

## 2015-06-01 MED ORDER — ALENDRONATE SODIUM 70 MG PO TABS: 70.0000 mg | ORAL_TABLET | ORAL | Status: DC

## 2015-06-01 MED ORDER — VITAMIN D 1000 UNITS PO TABS: 1000.0000 [IU] | ORAL_TABLET | Freq: Every day | ORAL | Status: AC

## 2015-06-01 MED ORDER — LISINOPRIL 10 MG PO TABS: 10.0000 mg | ORAL_TABLET | Freq: Every day | ORAL | Status: DC

## 2015-06-01 NOTE — Progress Notes (Signed)
Subjective:    Patient ID: Yvonne Rivers, female    DOB: 05-08-1946, 69 y.o.   MRN: [id number]  CC: follow up multiple med problems  HPI: Yvonne Rivers is a 69 y.o. female presenting on 06/01/2015 for follow up multiple med problems.  Hyperlipidemia: stopped atorvastatin over two years ago. Wants to stay off of it if possible. Not a smoker. No hx diabetes.  Neuropathy: improved since stopped working. Takes gabapentin two times a day. Wonders if she can stop.  Anxiety: gets really nervous at times, husband in a wheelchair for past 3 years. Lots of stress taking care of him, multiple family memebrs recently passed away  Weight loss: lost 30 lbs over past 3 years with stress from husband being in and out of the hospital. Appetite down, says she doesn't get hungry much.   Insomnia: takes temazepam when needed apprx 2 times a week, doesn't need it every night. Was on ambien in the past, doing better with sleep since stopped working  Depression: feels like no way out sometimes. Has been taking time for herself  Colon cancer screening: no family hx of colon cancer. She had one done over 10 years ago, had bad experience, wants to do fecal occult yearly instead.  HTN: sometimes gets lightheaded when she stands up. Sometimes BPs at home have 80s-90s. Highest of 720 systolic, 94-70  Back pain: takes meloxicam as needed, not every day  GAD7 Feeling nervous, anxious or on edge 2 Not being able to stop or control worrying 2 Worrying too much about different things 3 Trouble relaxing 2 Being so restless that it is hard to sit still 2 Becoming easily annoyed or irritable 1 Feeling afraid as if something awful might happen 0        Total Score 12   Depression screen [facility] 2/9 06/01/2015 11/24/2014 06/01/2014  Decreased Interest 1 0 0  Down, Depressed, Hopeless 1 0 2  PHQ - 2 Score 2 0 2  Altered sleeping 2 - 3  Tired, decreased energy 0 - 1  Change in appetite 0 - 1  Feeling bad or  failure about yourself  0 - 0  Trouble concentrating 0 - 0  Moving slowly or fidgety/restless 0 - 0  Suicidal thoughts 0 - 0  PHQ-9 Score 4 - 7  Difficult doing work/chores Somewhat difficult - -       Relevant past medical, surgical, family and social history reviewed and updated as indicated. Interim medical history since our last visit reviewed. Allergies and medications reviewed and updated.   ROS: All systems negative other than what is in HPI  Past Medical History Patient Active Problem List   Diagnosis Date Noted  . Osteoporosis 06/09/2014  . Essential hypertension, benign 06/01/2014  . Hyperlipidemia with target LDL less than 100 06/01/2014  . Low back pain 06/01/2014  . Insomnia 06/01/2014  . Hereditary and idiopathic peripheral neuropathy 06/01/2014    Current Outpatient Prescriptions  Medication Sig Dispense Refill  . gabapentin (NEURONTIN) 300 MG capsule TAKE ONE CAPSULE BY MOUTH THREE TIMES DAILY 270 capsule 0  . meloxicam (MOBIC) 15 MG tablet Take 1 tablet (15 mg total) by mouth daily. 30 tablet 5  . temazepam (RESTORIL) 15 MG capsule TAKE ONE CAPSULE BY MOUTH ONCE DAILY AT BEDTIME 15 capsule 5  . alendronate (FOSAMAX) 70 MG tablet Take 1 tablet (70 mg total) by mouth every 7 (seven) days. Take with a full glass of water on an empty stomach.  4 tablet 11  . cholecalciferol (VITAMIN D) 1000 UNITS tablet Take 1 tablet (1,000 Units total) by mouth daily.    . citalopram (CELEXA) 20 MG tablet Take 1 tablet (20 mg total) by mouth daily. 30 tablet 3  . lisinopril (PRINIVIL,ZESTRIL) 10 MG tablet Take 1 tablet (10 mg total) by mouth daily. 90 tablet 2   No current facility-administered medications for this visit.       Objective:    BP 115/70 mmHg  Pulse 65  Temp(Src) 97.6 F (36.4 C) (Oral)  Ht 5' (1.524 m)  Wt 114 lb 9.6 oz (51.982 kg)  BMI 22.38 kg/m2  Wt Readings from Last 3 Encounters:  06/01/15 114 lb 9.6 oz (51.982 kg)  11/24/14 114 lb (51.71 kg)    11/16/14 114 lb (51.71 kg)    Gen: NAD, alert, cooperative with exam, thin female EYES: EOMI, no scleral injection or icterus ENT:  OP without erythema LYMPH: no cervical LAD CV: NRRR, normal S1/S2, no murmur, distal pulses 2+ b/l Resp: CTABL, no wheezes, normal WOB Abd: +BS, soft, NTND. no guarding or organomegaly Ext: No edema, warm Neuro: Alert and oriented, strength equal b/l UE and LE, coordination grossly normal Psych: normal affect, mood is "ok"     Assessment & Plan:    Yvonne Rivers was seen today for follow up multiple med problems  Diagnoses and all orders for this visit:  Essential hypertension, benign BPs low at home, often symptomatic, on lisinopril 40mg . Will decrease to 10mg . Pt to continue to check at home, will let me know if elevated. Will check labs as below.  -     lisinopril (PRINIVIL,ZESTRIL) 10 MG tablet; Take 1 tablet (10 mg total) by mouth daily. -     BMP8+EGFR  Hyperlipidemia with target LDL less than 100 Recheck lipid panel, was well controlled 1.5 yrs ago, not sure if she was on aotrvastatin at that time or not. No smoking, no diabetes, does have HTN. No h/o MI, CVA. -     Lipid panel  Hereditary and idiopathic peripheral neuropathy Continue gabapentin 300mg  at night. OK to stop if she doesn't think it is helping. Neuorpathy improved a lot after she stopped working. Standing on feet all day.  Osteoporosis Was not able to afford previous med that was prescribed. Try below. WIll let me know if too expensive. Continue vitamin D, start calcium daily as well. Daily walking. -     alendronate (FOSAMAX) 70 MG tablet; Take 1 tablet (70 mg total) by mouth every 7 (seven) days. Take with a full glass of water on an empty stomach. -     cholecalciferol (VITAMIN D) 1000 UNITS tablet; Take 1 tablet (1,000 Units total) by mouth daily.  Midline low back pain without sciatica Bothers her occasionally, continue meloxicam as needed  Insomnia Takes apprx 2 times a week when  needed. Will give #15 tabs with refills. Will follow up in 8 weeks for anxiety. Continue to reassess need. -     temazepam (RESTORIL) 15 MG capsule; TAKE ONE CAPSULE BY MOUTH ONCE DAILY AT BEDTIME  Generalized anxiety disorder GAD 7 score is 12. WIll start citalopram, take 1/2 tab for first 8 days. RTC 8 weeks for reassess.  -     citalopram (CELEXA) 20 MG tablet; Take 1 tablet (20 mg total) by mouth daily.    Follow up plan: Return in about 8 weeks (around 07/27/2015).  Assunta Found, MD [facility] 06/01/2015, 9:55 AM

## 2015-06-02 LAB — LIPID PANEL
Chol/HDL Ratio: 2.1 ratio units (ref 0.0–4.4)
Cholesterol, Total: 193 mg/dL (ref 100–199)
HDL: 94 mg/dL (ref >39)
LDL Calculated: 86 mg/dL (ref 0–99)
TRIGLYCERIDES: 64 mg/dL (ref 0–149)
VLDL Cholesterol Cal: 13 mg/dL (ref 5–40)

## 2015-06-02 LAB — BMP8+EGFR
BUN / CREAT RATIO: 11 (ref 11–26)
BUN: 8 mg/dL (ref 8–27)
CO2: 27 mmol/L (ref 18–29)
CREATININE: 0.75 mg/dL (ref 0.57–1.00)
Calcium: 9.6 mg/dL (ref 8.7–10.3)
Chloride: 101 mmol/L (ref 97–106)
GFR calc non Af Amer: 83 mL/min/{1.73_m2} (ref >59)
GFR, EST AFRICAN AMERICAN: 95 mL/min/{1.73_m2} (ref >59)
Glucose: 88 mg/dL (ref 65–99)
Potassium: 4.5 mmol/L (ref 3.5–5.2)
SODIUM: 141 mmol/L (ref 136–144)

## 2015-06-07 ENCOUNTER — Other Ambulatory Visit: Payer: Medicare Other

## 2015-06-07 DIAGNOSIS — Z1212 Encounter for screening for malignant neoplasm of rectum: Secondary | ICD-10-CM | POA: Diagnosis not present

## 2015-06-07 NOTE — Progress Notes (Signed)
Lab only 

## 2015-06-08 ENCOUNTER — Telehealth: Payer: Self-pay | Admitting: Pediatrics

## 2015-06-08 NOTE — Telephone Encounter (Signed)
Called pt with lab results, all looked good. AsCVD risk of 6% over next 10 years, will not start statin at this time.

## 2015-06-08 NOTE — Telephone Encounter (Signed)
Patient is calling her lab results

## 2015-06-11 LAB — FECAL OCCULT BLOOD, IMMUNOCHEMICAL: Fecal Occult Bld: NEGATIVE

## 2015-07-26 ENCOUNTER — Encounter: Payer: Self-pay | Admitting: Nurse Practitioner

## 2015-07-26 ENCOUNTER — Ambulatory Visit (INDEPENDENT_AMBULATORY_CARE_PROVIDER_SITE_OTHER): Payer: Medicare Other | Admitting: Nurse Practitioner

## 2015-07-26 VITALS — BP 130/77 | HR 79 | Temp 97.4°F | Ht 60.0 in | Wt 113.0 lb

## 2015-07-26 DIAGNOSIS — J069 Acute upper respiratory infection, unspecified: Secondary | ICD-10-CM | POA: Diagnosis not present

## 2015-07-26 MED ORDER — AZITHROMYCIN 250 MG PO TABS: ORAL_TABLET | ORAL | Status: DC

## 2015-07-26 MED ORDER — HYDROCODONE-HOMATROPINE 5-1.5 MG/5ML PO SYRP: 5.0000 mL | ORAL_SOLUTION | Freq: Four times a day (QID) | ORAL | Status: DC | PRN

## 2015-07-26 NOTE — Patient Instructions (Signed)

## 2015-07-26 NOTE — Progress Notes (Signed)
  Subjective:     Yvonne Rivers is a 70 y.o. female who presents for evaluation of sinus pain. Symptoms include: congestion, cough, nasal congestion, post nasal drip and sore throat. Onset of symptoms was 5 days ago. Symptoms have been unchanged since that time. Past history is significant for no history of pneumonia or bronchitis. Patient is a non-smoker.  The following portions of the patient's history were reviewed and updated as appropriate: allergies, current medications, past family history, past medical history, past social history, past surgical history and problem list.  Review of Systems Pertinent items are noted in HPI.   Objective:    BP 130/77 mmHg  Pulse 79  Temp(Src) 97.4 F (36.3 C) (Oral)  Ht 5' (1.524 m)  Wt 113 lb (51.256 kg)  BMI 22.07 kg/m2 General appearance: alert and cooperative Eyes: conjunctivae/corneas clear. PERRL, EOM's intact. Fundi benign. Ears: normal TM's and external ear canals both ears Nose: clear discharge, moderate congestion, no sinus tenderness Throat: lips, mucosa, and tongue normal; teeth and gums normal Neck: no adenopathy, no carotid bruit, no JVD, supple, symmetrical, trachea midline and thyroid not enlarged, symmetric, no tenderness/mass/nodules Lungs: clear to auscultation bilaterally and DRY COUGH Heart: regular rate and rhythm, S1, S2 normal, no murmur, click, rub or gallop    Assessment:    Acute bacterial URI with cough .    Plan:   1. Take meds as prescribed 2. Use a cool mist humidifier especially during the winter months and when heat has been humid. 3. Use saline nose sprays frequently 4. Saline irrigations of the nose can be very helpful if done frequently.  * 4X daily for 1 week*  * Use of a nettie pot can be helpful with this. Follow directions with this* 5. Drink plenty of fluids 6. Keep thermostat turn down low 7.For any cough or congestion  Use plain Mucinex- regular strength or max strength is fine   *  Children- consult with Pharmacist for dosing 8. For fever or aces or pains- take tylenol or ibuprofen appropriate for age and weight.  * for fevers greater than 101 orally you may alternate ibuprofen and tylenol every  3 hours.   Meds ordered this encounter  Medications  . azithromycin (ZITHROMAX) 250 MG tablet    Sig: Two tablets day one, then one tablet daily next 4 days.    Dispense:  6 tablet    Refill:  0    Order Specific Question:  Supervising Provider    Answer:  Chipper Herb [[id number]]  . HYDROcodone-homatropine (HYCODAN) 5-1.5 MG/5ML syrup    Sig: Take 5 mLs by mouth every 6 (six) hours as needed for cough.    Dispense:  120 mL    Refill:  0    Order Specific Question:  Supervising Provider    Answer:  Chipper Herb [id number]   Mary-Margaret Hassell Done, FNP

## 2015-07-28 ENCOUNTER — Ambulatory Visit (INDEPENDENT_AMBULATORY_CARE_PROVIDER_SITE_OTHER): Payer: Medicare Other | Admitting: Pediatrics

## 2015-07-28 ENCOUNTER — Encounter: Payer: Self-pay | Admitting: Pediatrics

## 2015-07-28 VITALS — BP 135/80 | HR 67 | Temp 97.5°F | Ht 60.0 in | Wt 112.6 lb

## 2015-07-28 DIAGNOSIS — G4700 Insomnia, unspecified: Secondary | ICD-10-CM | POA: Diagnosis not present

## 2015-07-28 DIAGNOSIS — M1990 Unspecified osteoarthritis, unspecified site: Secondary | ICD-10-CM | POA: Diagnosis not present

## 2015-07-28 DIAGNOSIS — G609 Hereditary and idiopathic neuropathy, unspecified: Secondary | ICD-10-CM | POA: Diagnosis not present

## 2015-07-28 DIAGNOSIS — I10 Essential (primary) hypertension: Secondary | ICD-10-CM | POA: Diagnosis not present

## 2015-07-28 DIAGNOSIS — F411 Generalized anxiety disorder: Secondary | ICD-10-CM

## 2015-07-28 DIAGNOSIS — R634 Abnormal weight loss: Secondary | ICD-10-CM | POA: Diagnosis not present

## 2015-07-28 MED ORDER — MELOXICAM 15 MG PO TABS: 15.0000 mg | ORAL_TABLET | Freq: Every day | ORAL | Status: DC

## 2015-07-28 MED ORDER — FLUTICASONE PROPIONATE 50 MCG/ACT NA SUSP: 2.0000 | Freq: Every day | NASAL | Status: DC

## 2015-07-28 MED ORDER — CITALOPRAM HYDROBROMIDE 20 MG PO TABS: 20.0000 mg | ORAL_TABLET | Freq: Every day | ORAL | Status: DC

## 2015-07-28 NOTE — Patient Instructions (Signed)
Citalopram every day for anxiety, once a day  Flonase two sprays each nostril while feeling congested

## 2015-07-28 NOTE — Progress Notes (Signed)
Subjective:    Patient ID: Yvonne Rivers, female    DOB: 1945/10/22, 70 y.o.   MRN: [id number]  CC: Follow-up multiple med problems  HPI: Yvonne Rivers is a 70 y.o. female presenting for Follow-up  Anxiety: taking citalopram as needed, takes apprx 3-4 times a week. Thinks it is helping. Didn't realize she was supposed to take it every day. Husband still sick often, wheels chair bound. Primary care giver is pt.   Insomnia: pt doing well sleeping, still using temazepam a couple times a week.  HTN: No headaches or lightheadedness  Weight loss: trying to eat three meals  Neuropathy: taking gabapentin twice a day  GAD7 Feeling nervous, anxious or on edge 1 Not being able to stop or control worrying 3 Worrying too much about different things 3 Trouble relaxing 1 Being so restless that it is hard to sit still 1 Becoming easily annoyed or irritable 0 Feeling afraid as if something awful might happen 0        Total Score 9    Depression screen [facility] 2/9 07/28/2015 07/26/2015 06/01/2015 11/24/2014 06/01/2014  Decreased Interest 0 0 1 0 0  Down, Depressed, Hopeless 0 0 1 0 2  PHQ - 2 Score 0 0 2 0 2  Altered sleeping - - 2 - 3  Tired, decreased energy - - 0 - 1  Change in appetite - - 0 - 1  Feeling bad or failure about yourself  - - 0 - 0  Trouble concentrating - - 0 - 0  Moving slowly or fidgety/restless - - 0 - 0  Suicidal thoughts - - 0 - 0  PHQ-9 Score - - 4 - 7  Difficult doing work/chores - - Somewhat difficult - -     Relevant past medical, surgical, family and social history reviewed and updated as indicated. Interim medical history since our last visit reviewed. Allergies and medications reviewed and updated.    ROS: Per HPI unless specifically indicated above  History  Smoking status  . Never Smoker   Smokeless tobacco  . Never Used    Past Medical History Patient Active Problem List   Diagnosis Date Noted  . Loss of weight 07/31/2015  . Generalized  anxiety disorder 07/31/2015  . Arthritis 07/31/2015  . Osteoporosis 06/09/2014  . Essential hypertension, benign 06/01/2014  . Hyperlipidemia with target LDL less than 100 06/01/2014  . Low back pain 06/01/2014  . Insomnia 06/01/2014  . Hereditary and idiopathic peripheral neuropathy 06/01/2014    Current Outpatient Prescriptions  Medication Sig Dispense Refill  . alendronate (FOSAMAX) 70 MG tablet Take 1 tablet (70 mg total) by mouth every 7 (seven) days. Take with a full glass of water on an empty stomach. 4 tablet 11  . azithromycin (ZITHROMAX) 250 MG tablet Two tablets day one, then one tablet daily next 4 days. 6 tablet 0  . cholecalciferol (VITAMIN D) 1000 UNITS tablet Take 1 tablet (1,000 Units total) by mouth daily.    . citalopram (CELEXA) 20 MG tablet Take 1 tablet (20 mg total) by mouth daily. 30 tablet 3  . gabapentin (NEURONTIN) 300 MG capsule TAKE ONE CAPSULE BY MOUTH THREE TIMES DAILY 270 capsule 0  . lisinopril (PRINIVIL,ZESTRIL) 10 MG tablet Take 1 tablet (10 mg total) by mouth daily. 90 tablet 2  . meloxicam (MOBIC) 15 MG tablet Take 1 tablet (15 mg total) by mouth daily. 30 tablet 5  . temazepam (RESTORIL) 15 MG capsule TAKE ONE CAPSULE BY  MOUTH ONCE DAILY AT BEDTIME 15 capsule 5  . HYDROcodone-homatropine (HYCODAN) 5-1.5 MG/5ML syrup Take 5 mLs by mouth every 6 (six) hours as needed for cough. (Patient not taking: Reported on 07/28/2015) 120 mL 0   No current facility-administered medications for this visit.       Objective:    BP 135/80 mmHg  Pulse 67  Temp(Src) 97.5 F (36.4 C) (Oral)  Ht 5' (1.524 m)  Wt 112 lb 9.6 oz (51.075 kg)  BMI 21.99 kg/m2  Wt Readings from Last 3 Encounters:  07/28/15 112 lb 9.6 oz (51.075 kg)  07/26/15 113 lb (51.256 kg)  06/01/15 114 lb 9.6 oz (51.982 kg)   Gen: NAD, alert, cooperative with exam, NCAT EYES: EOMI, no scleral injection or icterus ENT:   OP without erythema LYMPH: no cervical LAD CV: NRRR, normal S1/S2, no  murmur, distal pulses 2+ b/l Resp: CTABL, no wheezes, normal WOB Abd: +BS, soft, NTND. no guarding or organomegaly Ext: No edema, warm Neuro: Alert and oriented, strength equal b/l UE and LE, coordination grossly normal Psych: normal affect     Assessment & Plan:    Yvonne Rivers was seen today for follow-up mulitple med problems  Diagnoses and all orders for this visit:  Generalized anxiety disorder Situational anxiety with taking care of sick husband. Take celexa daily. Can go up on dose if not improving. FEels slightly better since starting it, GAD7 score improved. -     citalopram (CELEXA) 20 MG tablet; Take 1 tablet (20 mg total) by mouth daily.  Arthritis Takes mobic as needed, helps with back pain. -     meloxicam (MOBIC) 15 MG tablet; Take 1 tablet (15 mg total) by mouth daily.  Essential hypertension, benign Well controllec today, cont current medications  Hereditary and idiopathic peripheral neuropathy Continue gabapentin, pt thinks it is helping  Insomnia Takes temazepam a couple times a week when she cant fall asleep.  Loss of weight She thinks related to the stress of taking care of her husband.  Other orders -     fluticasone (FLONASE) 50 MCG/ACT nasal spray; Place 2 sprays into both nostrils daily.   Follow up plan: Return in about 3 months (around 10/26/2015).  Assunta Found, MD [facility] 07/28/2015, 9:00 AM

## 2015-07-31 DIAGNOSIS — F411 Generalized anxiety disorder: Secondary | ICD-10-CM | POA: Insufficient documentation

## 2015-07-31 DIAGNOSIS — R634 Abnormal weight loss: Secondary | ICD-10-CM | POA: Insufficient documentation

## 2015-07-31 DIAGNOSIS — M1990 Unspecified osteoarthritis, unspecified site: Secondary | ICD-10-CM | POA: Insufficient documentation

## 2015-10-13 ENCOUNTER — Other Ambulatory Visit: Payer: Self-pay | Admitting: Nurse Practitioner

## 2015-11-29 DIAGNOSIS — H1013 Acute atopic conjunctivitis, bilateral: Secondary | ICD-10-CM | POA: Diagnosis not present

## 2016-01-25 ENCOUNTER — Ambulatory Visit (INDEPENDENT_AMBULATORY_CARE_PROVIDER_SITE_OTHER): Payer: Medicare Other | Admitting: Pediatrics

## 2016-01-25 ENCOUNTER — Encounter: Payer: Self-pay | Admitting: Pediatrics

## 2016-01-25 VITALS — BP 123/77 | HR 59 | Temp 97.2°F | Ht 60.0 in | Wt 115.2 lb

## 2016-01-25 DIAGNOSIS — M1990 Unspecified osteoarthritis, unspecified site: Secondary | ICD-10-CM

## 2016-01-25 DIAGNOSIS — R634 Abnormal weight loss: Secondary | ICD-10-CM | POA: Diagnosis not present

## 2016-01-25 DIAGNOSIS — G4700 Insomnia, unspecified: Secondary | ICD-10-CM | POA: Diagnosis not present

## 2016-01-25 DIAGNOSIS — F411 Generalized anxiety disorder: Secondary | ICD-10-CM | POA: Diagnosis not present

## 2016-01-25 DIAGNOSIS — I10 Essential (primary) hypertension: Secondary | ICD-10-CM | POA: Diagnosis not present

## 2016-01-25 DIAGNOSIS — M810 Age-related osteoporosis without current pathological fracture: Secondary | ICD-10-CM | POA: Diagnosis not present

## 2016-01-25 DIAGNOSIS — G609 Hereditary and idiopathic neuropathy, unspecified: Secondary | ICD-10-CM | POA: Diagnosis not present

## 2016-01-25 DIAGNOSIS — Z1239 Encounter for other screening for malignant neoplasm of breast: Secondary | ICD-10-CM | POA: Diagnosis not present

## 2016-01-25 MED ORDER — ALENDRONATE SODIUM 70 MG PO TABS: 70.0000 mg | ORAL_TABLET | ORAL | Status: DC

## 2016-01-25 MED ORDER — MELOXICAM 15 MG PO TABS: 15.0000 mg | ORAL_TABLET | Freq: Every day | ORAL | Status: DC

## 2016-01-25 MED ORDER — TEMAZEPAM 15 MG PO CAPS: ORAL_CAPSULE | ORAL | Status: DC

## 2016-01-25 MED ORDER — LISINOPRIL 10 MG PO TABS: 10.0000 mg | ORAL_TABLET | Freq: Every day | ORAL | Status: DC

## 2016-01-25 MED ORDER — CITALOPRAM HYDROBROMIDE 20 MG PO TABS: 20.0000 mg | ORAL_TABLET | Freq: Every day | ORAL | Status: DC

## 2016-01-25 NOTE — Progress Notes (Signed)
Subjective:    Patient ID: Yvonne Rivers, female    DOB: Oct 01, 1945, 70 y.o.   MRN: [id number]  CC: Follow-up multiple med problems  HPI: Yvonne Rivers is a 70 y.o. female presenting for Follow-up  Insomnia: taking restoril as needed, 1-2 times a week  Arthritis: meloxicam helping  HTN: taking meds regularly, no HA  Has a small bleb on L eye, followed by eye doctor, not improving with meds from ophthalmology for past 30 days Was told it was a cyst and would need to have surgery to remove it if still bothering her  Not taking gabapentin regularly, she says she was told she had neuropathy in her feet but they arent bothering her now  Husband is doing better, she feels less stressed. Not taking citalopram regularly  Depression screen [facility] 2/9 01/25/2016 07/28/2015 07/26/2015 06/01/2015 11/24/2014  Decreased Interest 0 0 0 1 0  Down, Depressed, Hopeless 0 0 0 1 0  PHQ - 2 Score 0 0 0 2 0  Altered sleeping - - - 2 -  Tired, decreased energy - - - 0 -  Change in appetite - - - 0 -  Feeling bad or failure about yourself  - - - 0 -  Trouble concentrating - - - 0 -  Moving slowly or fidgety/restless - - - 0 -  Suicidal thoughts - - - 0 -  PHQ-9 Score - - - 4 -  Difficult doing work/chores - - - Somewhat difficult -     Relevant past medical, surgical, family and social history reviewed and updated as indicated.  Interim medical history since our last visit reviewed. Allergies and medications reviewed and updated.  ROS: Per HPI unless specifically indicated above  History  Smoking status  . Never Smoker   Smokeless tobacco  . Never Used       Objective:    BP 123/77 mmHg  Pulse 59  Temp(Src) 97.2 F (36.2 C) (Oral)  Ht 5' (1.524 m)  Wt 115 lb 3.2 oz (52.254 kg)  BMI 22.50 kg/m2  Wt Readings from Last 3 Encounters:  01/25/16 115 lb 3.2 oz (52.254 kg)  07/28/15 112 lb 9.6 oz (51.075 kg)  07/26/15 113 lb (51.256 kg)    Gen: NAD, alert, cooperative with exam,  NCAT EYES: EOMI, no scleral injection or icterus. L eye with small apprx 50mm bleb medial lower edge of iris. CV: NRRR, normal S1/S2, no murmur, distal pulses 2+ b/l Resp: CTABL, no wheezes, normal WOB Abd: +BS, soft, NTND. no guarding or organomegaly Ext: No edema, warm. Intact sensation feet and LE>  Neuro: Alert and oriented     Assessment & Plan:    [place] was seen today for follow-up multiple med problems.  Diagnoses and all orders for this visit:  Essential hypertension, benign Adequate control, continue below. BMP due 05/2016 -     lisinopril (PRINIVIL,ZESTRIL) 10 MG tablet; Take 1 tablet (10 mg total) by mouth daily.  Hereditary and idiopathic peripheral neuropathy Pt with no symptoms. Wants to stop gabapentin. Will do trial off of gabapentin.  Arthritis Continue meloxicam, symptoms improved -     meloxicam (MOBIC) 15 MG tablet; Take 1 tablet (15 mg total) by mouth daily.  Insomnia Continue to use rarely as needed for insomnia -     temazepam (RESTORIL) 15 MG capsule; TAKE ONE CAPSULE BY MOUTH ONCE DAILY AT BEDTIME  Generalized anxiety disorder Feeling better. Not taking citalopram regularly but thinks she might start again. -  citalopram (CELEXA) 20 MG tablet; Take 1 tablet (20 mg total) by mouth daily.  Loss of weight Weight stabilized.  Osteoporosis Started treatment 05/2015. -     alendronate (FOSAMAX) 70 MG tablet; Take 1 tablet (70 mg total) by mouth every 7 (seven) days. Take with a full glass of water on an empty stomach.  Breast cancer screening -     MM Digital Screening; Future     Follow up plan: Return in about 6 months (around 07/27/2016).  Assunta Found, MD [facility] 01/25/2016, 9:11 AM

## 2016-01-27 ENCOUNTER — Encounter: Payer: Self-pay | Admitting: Family Medicine

## 2016-02-23 ENCOUNTER — Telehealth: Payer: Self-pay | Admitting: Pediatrics

## 2016-02-27 DIAGNOSIS — H11442 Conjunctival cysts, left eye: Secondary | ICD-10-CM | POA: Diagnosis not present

## 2016-03-21 ENCOUNTER — Ambulatory Visit (INDEPENDENT_AMBULATORY_CARE_PROVIDER_SITE_OTHER): Payer: Medicare Other | Admitting: Pharmacist

## 2016-03-21 ENCOUNTER — Encounter: Payer: Self-pay | Admitting: Pharmacist

## 2016-03-21 VITALS — BP 126/78 | HR 72 | Ht 60.0 in | Wt 113.5 lb

## 2016-03-21 DIAGNOSIS — Z0000 Encounter for general adult medical examination without abnormal findings: Secondary | ICD-10-CM

## 2016-03-21 DIAGNOSIS — M810 Age-related osteoporosis without current pathological fracture: Secondary | ICD-10-CM

## 2016-03-21 DIAGNOSIS — Z23 Encounter for immunization: Secondary | ICD-10-CM | POA: Diagnosis not present

## 2016-03-21 NOTE — Patient Instructions (Addendum)
  Yvonne Rivers , Thank you for taking time to come for your Medicare Wellness Visit. I appreciate your ongoing commitment to your health goals. Please review the following plan we discussed and let me know if I can assist you in the future.   These are the goals we discussed: Continue to eat a variety of fruits and vegetables. Choose lean and low fat proteins / meats.  Limit red meat to 1-2 times per week. Limit sugar intake.   Continue to exercise - goal is 150 minute each week   This is a list of the screening recommended for you and due dates:  Health Maintenance  Topic Date Due  . Pneumonia vaccines (2 of 2 - PCV13) Done today - completed  . Flu Shot  02/21/2016  . Colon Cancer Screening  Try to bring report from test done by [facility].  . Shingles Vaccine  05/31/2016*  .  Hepatitis C: One time screening is recommended by Center for Disease Control  (CDC) for  adults born from [birth year] through 1963.   05/31/2016*  . Stool Blood Test  06/06/2016  . DEXA scan (bone density measurement)  06/09/2016  . Mammogram  12/14/2016  . Tetanus Vaccine  06/02/2019  *Topic was postponed. The date shown is not the original due date.

## 2016-03-21 NOTE — Progress Notes (Signed)
Patient ID: Yvonne Rivers, female   DOB: [birth year], 70 y.o.   MRN: [id number]    Subjective:   Yvonne Rivers is a 70 y.o. female who presents for a Subsequent Medicare Annual Wellness Visit.  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 She is married and lives with her husband.  He is wheelchair bound and requires assistance with ADLs. Clara has 2 children, 1 granddaughter, and 2 great grandchildren.    Review of Systems  Review of Systems  Eyes: Negative.   Respiratory: Negative.   Cardiovascular: Negative.   Gastrointestinal: Negative.   Genitourinary: Negative.   Musculoskeletal: Negative.   Neurological: Negative.   Endo/Heme/Allergies: Negative.   Psychiatric/Behavioral: Negative.      Current Medications (verified) Outpatient Encounter Prescriptions as of 03/21/2016  Medication Sig  . alendronate (FOSAMAX) 70 MG tablet Take 1 tablet (70 mg total) by mouth every 7 (seven) days. Take with a full glass of water on an empty stomach.  . cholecalciferol (VITAMIN D) 1000 UNITS tablet Take 1 tablet (1,000 Units total) by mouth daily.  . citalopram (CELEXA) 20 MG tablet Take 1 tablet (20 mg total) by mouth daily.  . fluticasone (FLONASE) 50 MCG/ACT nasal spray Place 2 sprays into both nostrils daily.  Marland Kitchen lisinopril (PRINIVIL,ZESTRIL) 10 MG tablet Take 1 tablet (10 mg total) by mouth daily.  . meloxicam (MOBIC) 15 MG tablet Take 1 tablet (15 mg total) by mouth daily.  . RESTASIS MULTIDOSE 0.05 % ophthalmic emulsion   . temazepam (RESTORIL) 15 MG capsule TAKE ONE CAPSULE BY MOUTH ONCE DAILY AT BEDTIME  . [DISCONTINUED] gabapentin (NEURONTIN) 300 MG capsule TAKE ONE CAPSULE BY MOUTH THREE TIMES DAILY (Patient not taking: Reported on 03/21/2016)  . [DISCONTINUED] PAZEO 0.7 % SOLN   . [DISCONTINUED] prednisoLONE acetate (PRED FORTE) 1 % ophthalmic suspension    No facility-administered encounter medications on file as of  03/21/2016.     Allergies (verified) Review of patient's allergies indicates no known allergies.   History: Past Medical History:  Diagnosis Date  . Arthritis   . Hyperlipidemia   . Hypertension   . Osteoporosis    Past Surgical History:  Procedure Laterality Date  . CHOLECYSTECTOMY    . cyst removed from right breast    . Rotator cuff tear in right shoulder    . TUBAL LIGATION     Family History  Problem Relation Age of Onset  . Heart disease Mother     MI at 72years  . Cancer Father     lung  . Kidney disease Sister   . Diabetes Sister   . Cancer Sister     breast cancer  . Heart disease Sister 42    CABG age 71, CHF  . COPD Sister   . Cancer Brother     lung cancer  . Alzheimer's disease Brother   . Alzheimer's disease Brother   . Heart disease Brother     CHF  . Heart disease Brother 28    CABG  . Cancer Brother 55    leukemia  . Heart disease Brother 74    MI  . Heart disease Brother 51    CABG  . Heart disease Brother     stents  . COPD Brother    Social History   Occupational History  . Not on file.   Social History Main Topics  . Smoking status: Never Smoker  . Smokeless  tobacco: Never Used  . Alcohol use 0.0 oz/week     Comment: rarely   . Drug use: No  . Sexual activity: No      Dietary issues and exercise activities: Current Exercise Habits: Home exercise routine, Type of exercise: walking, Time (Minutes): 30, Frequency (Times/Week): 3, Weekly Exercise (Minutes/Week): 90, Intensity: Moderate  Current Dietary habits:  Low fat and avoids fried foods.    Objective:    Today's Vitals   03/21/16 1203  BP: 126/78  Pulse: 72  Weight: 113 lb 8 oz (51.5 kg)  Height: 5' (1.524 m)  PainSc: 2   PainLoc: Back   Body mass index is 22.17 kg/m.  Activities of Daily Living In your present state of health, do you have any difficulty performing the following activities: 03/21/2016  Hearing? N  Vision? N  Difficulty concentrating or  making decisions? N  Walking or climbing stairs? N  Dressing or bathing? N  Doing errands, shopping? N  Preparing Food and eating ? N  Using the Toilet? N  In the past six months, have you accidently leaked urine? N  Do you have problems with loss of bowel control? N  Managing your Medications? N  Managing your Finances? N  Housekeeping or managing your Housekeeping? N  Some recent data might be hidden     Cardiac Risk Factors include: advanced age (>82men, >1 women);dyslipidemia;family history of premature cardiovascular disease;hypertension  Depression Screen PHQ 2/9 Scores 03/21/2016 01/25/2016 07/28/2015 07/26/2015  PHQ - 2 Score 1 0 0 0  PHQ- 9 Score - - - -     Fall Risk Fall Risk  03/21/2016 01/25/2016 07/28/2015 07/26/2015 06/01/2015  Falls in the past year? No No No No No  Number falls in past yr: - - - - -  Injury with Fall? - - - - -  Follow up - - - - -    Cognitive Function: MMSE - Mini Mental State Exam 03/21/2016 11/25/2014  Orientation to time 5 5  Orientation to Place 5 5  Registration 3 3  Attention/ Calculation 4 0  Recall 3 3  Language- name 2 objects 2 2  Language- repeat 1 1  Language- follow 3 step command 3 3  Language- read & follow direction 1 1  Write a sentence 1 1  Copy design 1 1  Total score 29 25    Immunizations and Health Maintenance Immunization History  Administered Date(s) Administered  . Influenza,inj,Quad PF,36+ Mos 06/01/2014, 06/01/2015  . Pneumococcal Conjugate-13 03/21/2016  . Pneumococcal Polysaccharide-23 06/01/2014   Health Maintenance Due  Topic Date Due  . INFLUENZA VACCINE  02/21/2016    Patient Care Team: Eustaquio Maize, MD as PCP - General (Pediatrics)  Indicate any recent Medical Services you may have received from other than [facility] providers in the past year (date may be approximate).    Assessment:    Annual Wellness Visit    Screening Tests Health Maintenance  Topic Date Due  . INFLUENZA VACCINE  02/21/2016    . COLONOSCOPY  05/31/2016 (Originally 04/01/1998)  . ZOSTAVAX  05/31/2016 (Originally 04/01/2008)  . Hepatitis C Screening  05/31/2016 (Originally [birth year])  . COLON CANCER SCREENING ANNUAL FOBT  06/06/2016  . DEXA SCAN  06/09/2016  . MAMMOGRAM  12/14/2016  . TETANUS/TDAP  06/02/2019  . PNA vac Low Risk Adult  Completed        Plan:   During the course of the visit Korynne was educated and counseled about the following appropriate  screening and preventive services:   Vaccines to include Pneumoccal, Influenza, Td, Zostavax - prevnar 13 vaccines given today  Colorectal cancer screening - has testing done by [facility] within the last year.  Will bring in paperwork.   Cardiovascular disease screening - lipids are at goal.  BP at goal; last EKG 2015  Diabetes screening - last FBG was 88 (05/2015)  Bone Denisty / Osteoporosis Screening - ordered today  Mammogram - UTD  Nutrition counseling - continue current diet and maintain weight  Advanced Directives - UTD  Physical Activity - continue to exercise, try to increase to 150 minutes as able.  Orders Placed This Encounter  Procedures  . DG [facility] DEXA    Standing Status:   Future    Standing Expiration Date:   05/21/2017    Order Specific Question:   Reason for Exam (SYMPTOM  OR DIAGNOSIS REQUIRED)    Answer:   osteoporosis  . Pneumococcal conjugate vaccine 13-valent    Patient Instructions (the written plan) were given to the patient.   Cherre Robins, PharmD   03/23/2016

## 2016-04-16 ENCOUNTER — Encounter: Payer: Medicare Other | Admitting: *Deleted

## 2016-04-16 DIAGNOSIS — Z1231 Encounter for screening mammogram for malignant neoplasm of breast: Secondary | ICD-10-CM | POA: Diagnosis not present

## 2016-04-25 DIAGNOSIS — R921 Mammographic calcification found on diagnostic imaging of breast: Secondary | ICD-10-CM | POA: Diagnosis not present

## 2016-04-30 ENCOUNTER — Encounter: Payer: Self-pay | Admitting: Pediatrics

## 2016-04-30 ENCOUNTER — Encounter: Payer: Self-pay | Admitting: Nurse Practitioner

## 2016-04-30 DIAGNOSIS — R920 Mammographic microcalcification found on diagnostic imaging of breast: Secondary | ICD-10-CM | POA: Diagnosis not present

## 2016-04-30 DIAGNOSIS — R921 Mammographic calcification found on diagnostic imaging of breast: Secondary | ICD-10-CM | POA: Diagnosis not present

## 2016-05-09 DIAGNOSIS — D0512 Intraductal carcinoma in situ of left breast: Secondary | ICD-10-CM | POA: Diagnosis not present

## 2016-05-10 DIAGNOSIS — D0512 Intraductal carcinoma in situ of left breast: Secondary | ICD-10-CM | POA: Insufficient documentation

## 2016-05-22 DIAGNOSIS — D0512 Intraductal carcinoma in situ of left breast: Secondary | ICD-10-CM | POA: Diagnosis not present

## 2016-05-22 DIAGNOSIS — Z171 Estrogen receptor negative status [ER-]: Secondary | ICD-10-CM | POA: Diagnosis not present

## 2016-05-30 DIAGNOSIS — Z791 Long term (current) use of non-steroidal anti-inflammatories (NSAID): Secondary | ICD-10-CM | POA: Diagnosis not present

## 2016-05-30 DIAGNOSIS — N641 Fat necrosis of breast: Secondary | ICD-10-CM | POA: Diagnosis not present

## 2016-05-30 DIAGNOSIS — Z79899 Other long term (current) drug therapy: Secondary | ICD-10-CM | POA: Diagnosis not present

## 2016-05-30 DIAGNOSIS — D0592 Unspecified type of carcinoma in situ of left breast: Secondary | ICD-10-CM | POA: Diagnosis not present

## 2016-05-30 DIAGNOSIS — D0512 Intraductal carcinoma in situ of left breast: Secondary | ICD-10-CM | POA: Diagnosis not present

## 2016-05-30 DIAGNOSIS — I10 Essential (primary) hypertension: Secondary | ICD-10-CM | POA: Diagnosis not present

## 2016-05-30 DIAGNOSIS — M1990 Unspecified osteoarthritis, unspecified site: Secondary | ICD-10-CM | POA: Diagnosis not present

## 2016-05-30 DIAGNOSIS — C801 Malignant (primary) neoplasm, unspecified: Secondary | ICD-10-CM

## 2016-05-30 HISTORY — DX: Malignant (primary) neoplasm, unspecified: C80.1

## 2016-06-11 DIAGNOSIS — I10 Essential (primary) hypertension: Secondary | ICD-10-CM | POA: Diagnosis not present

## 2016-06-11 DIAGNOSIS — E785 Hyperlipidemia, unspecified: Secondary | ICD-10-CM | POA: Diagnosis not present

## 2016-06-11 DIAGNOSIS — Z9889 Other specified postprocedural states: Secondary | ICD-10-CM | POA: Diagnosis not present

## 2016-06-11 DIAGNOSIS — Z171 Estrogen receptor negative status [ER-]: Secondary | ICD-10-CM | POA: Diagnosis not present

## 2016-06-11 DIAGNOSIS — D0512 Intraductal carcinoma in situ of left breast: Secondary | ICD-10-CM | POA: Diagnosis not present

## 2016-06-12 ENCOUNTER — Other Ambulatory Visit: Payer: Medicare Other

## 2016-06-18 DIAGNOSIS — D0592 Unspecified type of carcinoma in situ of left breast: Secondary | ICD-10-CM | POA: Diagnosis not present

## 2016-06-20 ENCOUNTER — Telehealth: Payer: Self-pay | Admitting: Pediatrics

## 2016-06-20 DIAGNOSIS — Z171 Estrogen receptor negative status [ER-]: Secondary | ICD-10-CM | POA: Diagnosis not present

## 2016-06-20 DIAGNOSIS — D0512 Intraductal carcinoma in situ of left breast: Secondary | ICD-10-CM | POA: Diagnosis not present

## 2016-06-20 DIAGNOSIS — Z9012 Acquired absence of left breast and nipple: Secondary | ICD-10-CM | POA: Diagnosis not present

## 2016-06-20 DIAGNOSIS — E785 Hyperlipidemia, unspecified: Secondary | ICD-10-CM | POA: Diagnosis not present

## 2016-06-20 DIAGNOSIS — Z803 Family history of malignant neoplasm of breast: Secondary | ICD-10-CM | POA: Diagnosis not present

## 2016-06-20 DIAGNOSIS — Z801 Family history of malignant neoplasm of trachea, bronchus and lung: Secondary | ICD-10-CM | POA: Diagnosis not present

## 2016-06-20 DIAGNOSIS — I10 Essential (primary) hypertension: Secondary | ICD-10-CM | POA: Diagnosis not present

## 2016-06-21 DIAGNOSIS — R928 Other abnormal and inconclusive findings on diagnostic imaging of breast: Secondary | ICD-10-CM | POA: Diagnosis not present

## 2016-06-21 DIAGNOSIS — D0512 Intraductal carcinoma in situ of left breast: Secondary | ICD-10-CM | POA: Diagnosis not present

## 2016-06-25 DIAGNOSIS — D0512 Intraductal carcinoma in situ of left breast: Secondary | ICD-10-CM | POA: Diagnosis not present

## 2016-06-25 DIAGNOSIS — Z510 Encounter for antineoplastic radiation therapy: Secondary | ICD-10-CM | POA: Diagnosis not present

## 2016-06-25 DIAGNOSIS — D0592 Unspecified type of carcinoma in situ of left breast: Secondary | ICD-10-CM | POA: Diagnosis not present

## 2016-07-05 ENCOUNTER — Telehealth: Payer: Self-pay | Admitting: Pediatrics

## 2016-07-05 DIAGNOSIS — Z510 Encounter for antineoplastic radiation therapy: Secondary | ICD-10-CM | POA: Diagnosis not present

## 2016-07-05 DIAGNOSIS — D0592 Unspecified type of carcinoma in situ of left breast: Secondary | ICD-10-CM | POA: Diagnosis not present

## 2016-07-05 DIAGNOSIS — D0512 Intraductal carcinoma in situ of left breast: Secondary | ICD-10-CM | POA: Diagnosis not present

## 2016-07-05 NOTE — Telephone Encounter (Signed)
Pt has an appt tomorrow with her oncologist and she will ask them about the flu shot.

## 2016-07-06 DIAGNOSIS — D0592 Unspecified type of carcinoma in situ of left breast: Secondary | ICD-10-CM | POA: Diagnosis not present

## 2016-07-06 DIAGNOSIS — D0512 Intraductal carcinoma in situ of left breast: Secondary | ICD-10-CM | POA: Diagnosis not present

## 2016-07-06 DIAGNOSIS — Z510 Encounter for antineoplastic radiation therapy: Secondary | ICD-10-CM | POA: Diagnosis not present

## 2016-07-09 DIAGNOSIS — D0592 Unspecified type of carcinoma in situ of left breast: Secondary | ICD-10-CM | POA: Diagnosis not present

## 2016-07-09 DIAGNOSIS — Z510 Encounter for antineoplastic radiation therapy: Secondary | ICD-10-CM | POA: Diagnosis not present

## 2016-07-09 DIAGNOSIS — D0512 Intraductal carcinoma in situ of left breast: Secondary | ICD-10-CM | POA: Diagnosis not present

## 2016-07-10 DIAGNOSIS — Z510 Encounter for antineoplastic radiation therapy: Secondary | ICD-10-CM | POA: Diagnosis not present

## 2016-07-10 DIAGNOSIS — D0512 Intraductal carcinoma in situ of left breast: Secondary | ICD-10-CM | POA: Diagnosis not present

## 2016-07-11 DIAGNOSIS — D0512 Intraductal carcinoma in situ of left breast: Secondary | ICD-10-CM | POA: Diagnosis not present

## 2016-07-11 DIAGNOSIS — Z510 Encounter for antineoplastic radiation therapy: Secondary | ICD-10-CM | POA: Diagnosis not present

## 2016-07-12 DIAGNOSIS — D0512 Intraductal carcinoma in situ of left breast: Secondary | ICD-10-CM | POA: Diagnosis not present

## 2016-07-12 DIAGNOSIS — Z510 Encounter for antineoplastic radiation therapy: Secondary | ICD-10-CM | POA: Diagnosis not present

## 2016-07-13 DIAGNOSIS — D0512 Intraductal carcinoma in situ of left breast: Secondary | ICD-10-CM | POA: Diagnosis not present

## 2016-07-13 DIAGNOSIS — Z510 Encounter for antineoplastic radiation therapy: Secondary | ICD-10-CM | POA: Diagnosis not present

## 2016-07-17 DIAGNOSIS — D0512 Intraductal carcinoma in situ of left breast: Secondary | ICD-10-CM | POA: Diagnosis not present

## 2016-07-17 DIAGNOSIS — D0592 Unspecified type of carcinoma in situ of left breast: Secondary | ICD-10-CM | POA: Diagnosis not present

## 2016-07-17 DIAGNOSIS — Z510 Encounter for antineoplastic radiation therapy: Secondary | ICD-10-CM | POA: Diagnosis not present

## 2016-07-18 DIAGNOSIS — Z510 Encounter for antineoplastic radiation therapy: Secondary | ICD-10-CM | POA: Diagnosis not present

## 2016-07-18 DIAGNOSIS — D0512 Intraductal carcinoma in situ of left breast: Secondary | ICD-10-CM | POA: Diagnosis not present

## 2016-07-19 ENCOUNTER — Telehealth: Payer: Self-pay | Admitting: Pediatrics

## 2016-07-19 DIAGNOSIS — D0512 Intraductal carcinoma in situ of left breast: Secondary | ICD-10-CM | POA: Diagnosis not present

## 2016-07-19 DIAGNOSIS — Z510 Encounter for antineoplastic radiation therapy: Secondary | ICD-10-CM | POA: Diagnosis not present

## 2016-07-19 NOTE — Telephone Encounter (Signed)
I want her to keep checking blood pressures at home every day for the next week. Check when she has been sitting for 10-15 minutes, is feeling her most calm. If still regularly > 140 or >90 on bottom call back, we may need to adjust her medicines.

## 2016-07-19 NOTE — Telephone Encounter (Signed)
Patient aware and verbalizes understanding. 

## 2016-07-19 NOTE — Telephone Encounter (Signed)
Patient states that the last two weeks after radiation her bp has been higher than normal. Patient is not exactly sure what the readings where but one time she states it was 142/85. Patient states today she took bp and it was 146/83 and then waited awhile and took it and it was 128/79. Patient states the nurse told her after radiation to tell PCP in case she needed to have a higher does on lisinopril. Please advise.

## 2016-07-20 ENCOUNTER — Ambulatory Visit (INDEPENDENT_AMBULATORY_CARE_PROVIDER_SITE_OTHER): Payer: Medicare Other | Admitting: Family

## 2016-07-20 ENCOUNTER — Encounter: Payer: Self-pay | Admitting: Family

## 2016-07-20 VITALS — BP 136/90 | HR 67 | Temp 97.1°F | Ht 60.0 in | Wt 112.8 lb

## 2016-07-20 DIAGNOSIS — J069 Acute upper respiratory infection, unspecified: Secondary | ICD-10-CM | POA: Diagnosis not present

## 2016-07-20 DIAGNOSIS — Z510 Encounter for antineoplastic radiation therapy: Secondary | ICD-10-CM | POA: Diagnosis not present

## 2016-07-20 DIAGNOSIS — D0512 Intraductal carcinoma in situ of left breast: Secondary | ICD-10-CM | POA: Diagnosis not present

## 2016-07-20 MED ORDER — AZITHROMYCIN 250 MG PO TABS: ORAL_TABLET | ORAL | 0 refills | Status: DC

## 2016-07-20 MED ORDER — FLUTICASONE PROPIONATE 50 MCG/ACT NA SUSP: 2.0000 | Freq: Every day | NASAL | 6 refills | Status: DC

## 2016-07-20 MED ORDER — BENZONATATE 200 MG PO CAPS: 200.0000 mg | ORAL_CAPSULE | Freq: Three times a day (TID) | ORAL | 1 refills | Status: DC | PRN

## 2016-07-20 NOTE — Progress Notes (Signed)
Subjective:    Patient ID: Yvonne Rivers, female    DOB: May 19, 1946, 70 y.o.   MRN: [id number]  Cough  This is a new problem. The current episode started yesterday. The problem has been gradually worsening. The problem occurs every few minutes. The cough is productive of sputum. Associated symptoms include ear congestion, headaches, nasal congestion, postnasal drip and a sore throat. Pertinent negatives include no chills, ear pain, fever, myalgias, rhinorrhea, shortness of breath or wheezing. She has tried rest and OTC cough suppressant for the symptoms. The treatment provided mild relief.  Sore Throat   Associated symptoms include coughing and headaches. Pertinent negatives include no ear pain or shortness of breath.  Sinus Problem  Associated symptoms include coughing, headaches and a sore throat. Pertinent negatives include no chills, ear pain or shortness of breath.      Review of Systems  Constitutional: Negative for chills and fever.  HENT: Positive for postnasal drip and sore throat. Negative for ear pain and rhinorrhea.   Respiratory: Positive for cough. Negative for shortness of breath and wheezing.   Musculoskeletal: Negative for myalgias.  Neurological: Positive for headaches.  All other systems reviewed and are negative.      Objective:   Physical Exam  Constitutional: She is oriented to person, place, and time. She appears well-developed and well-nourished. No distress.  HENT:  Head: Normocephalic and atraumatic.  Right Ear: External ear normal.  Left Ear: External ear normal.  Nose: Mucosal edema and rhinorrhea present.  Mouth/Throat: Posterior oropharyngeal edema and posterior oropharyngeal erythema present.  Eyes: Pupils are equal, round, and reactive to light.  Neck: Normal range of motion. Neck supple. No thyromegaly present.  Cardiovascular: Normal rate, regular rhythm, normal heart sounds and intact distal pulses.   No murmur heard. Pulmonary/Chest: Effort  normal and breath sounds normal. No respiratory distress. She has no wheezes.  Abdominal: Soft. Bowel sounds are normal. She exhibits no distension. There is no tenderness.  Musculoskeletal: Normal range of motion. She exhibits no edema or tenderness.  Neurological: She is alert and oriented to person, place, and time. She has normal reflexes. No cranial nerve deficit.  Skin: Skin is warm and dry.  Psychiatric: She has a normal mood and affect. Her behavior is normal. Judgment and thought content normal.  Vitals reviewed.     BP 136/90   Pulse 67   Temp 97.1 F (36.2 C) (Oral)   Ht 5' (1.524 m)   Wt 112 lb 12.8 oz (51.2 kg)   BMI 22.03 kg/m      Assessment & Plan:  1. Acute upper respiratory infection - Take meds as prescribed - Use a cool mist humidifier  -Use saline nose sprays frequently -Saline irrigations of the nose can be very helpful if done frequently.  * 4X daily for 1 week*  * Use of a nettie pot can be helpful with this. Follow directions with this* -Force fluids -For any cough or congestion  Use plain Mucinex- regular strength or max strength is fine   * Children- consult with Pharmacist for dosing -For fever or aces or pains- take tylenol or ibuprofen appropriate for age and weight.  * for fevers greater than 101 orally you may alternate ibuprofen and tylenol every  3 hours. -Throat lozenges if help -New toothbrush in 3 days - azithromycin (ZITHROMAX) 250 MG tablet; Take 500 mg once, then 250 mg for four days  Dispense: 6 tablet; Refill: 0 - benzonatate (TESSALON) 200 MG capsule; Take 1  capsule (200 mg total) by mouth 3 (three) times daily as needed.  Dispense: 30 capsule; Refill: 1 - fluticasone (FLONASE) 50 MCG/ACT nasal spray; Place 2 sprays into both nostrils daily.  Dispense: 16 g; Refill: 6  *PT to wait 2-3 days to start antibiotic. Office is closed for Holiday for next few days.  Evelina Dun, FNP

## 2016-07-20 NOTE — Patient Instructions (Signed)
Upper Respiratory Infection, Adult Most upper respiratory infections (URIs) are a viral infection of the air passages leading to the lungs. A URI affects the nose, throat, and upper air passages. The most common type of URI is nasopharyngitis and is typically referred to as "the common cold." URIs run their course and usually go away on their own. Most of the time, a URI does not require medical attention, but sometimes a bacterial infection in the upper airways can follow a viral infection. This is called a secondary infection. Sinus and middle ear infections are common types of secondary upper respiratory infections. Bacterial pneumonia can also complicate a URI. A URI can worsen asthma and chronic obstructive pulmonary disease (COPD). Sometimes, these complications can require emergency medical care and may be life threatening. What are the causes? Almost all URIs are caused by viruses. A virus is a type of germ and can spread from one person to another. What increases the risk? You may be at risk for a URI if:  You smoke.  You have chronic heart or lung disease.  You have a weakened defense (immune) system.  You are very young or very old.  You have nasal allergies or asthma.  You work in crowded or poorly ventilated areas.  You work in health care facilities or schools.  What are the signs or symptoms? Symptoms typically develop 2-3 days after you come in contact with a cold virus. Most viral URIs last 7-10 days. However, viral URIs from the influenza virus (flu virus) can last 14-18 days and are typically more severe. Symptoms may include:  Runny or stuffy (congested) nose.  Sneezing.  Cough.  Sore throat.  Headache.  Fatigue.  Fever.  Loss of appetite.  Pain in your forehead, behind your eyes, and over your cheekbones (sinus pain).  Muscle aches.  How is this diagnosed? Your health care provider may diagnose a URI by:  Physical exam.  Tests to check that your  symptoms are not due to another condition such as: ? Strep throat. ? Sinusitis. ? Pneumonia. ? Asthma.  How is this treated? A URI goes away on its own with time. It cannot be cured with medicines, but medicines may be prescribed or recommended to relieve symptoms. Medicines may help:  Reduce your fever.  Reduce your cough.  Relieve nasal congestion.  Follow these instructions at home:  Take medicines only as directed by your health care provider.  Gargle warm saltwater or take cough drops to comfort your throat as directed by your health care provider.  Use a warm mist humidifier or inhale steam from a shower to increase air moisture. This may make it easier to breathe.  Drink enough fluid to keep your urine clear or pale yellow.  Eat soups and other clear broths and maintain good nutrition.  Rest as needed.  Return to work when your temperature has returned to normal or as your health care provider advises. You may need to stay home longer to avoid infecting others. You can also use a face mask and careful hand washing to prevent spread of the virus.  Increase the usage of your inhaler if you have asthma.  Do not use any tobacco products, including cigarettes, chewing tobacco, or electronic cigarettes. If you need help quitting, ask your health care provider. How is this prevented? The best way to protect yourself from getting a cold is to practice good hygiene.  Avoid oral or hand contact with people with cold symptoms.  Wash your   hands often if contact occurs.  There is no clear evidence that vitamin C, vitamin E, echinacea, or exercise reduces the chance of developing a cold. However, it is always recommended to get plenty of rest, exercise, and practice good nutrition. Contact a health care provider if:  You are getting worse rather than better.  Your symptoms are not controlled by medicine.  You have chills.  You have worsening shortness of breath.  You have  brown or red mucus.  You have yellow or brown nasal discharge.  You have pain in your face, especially when you bend forward.  You have a fever.  You have swollen neck glands.  You have pain while swallowing.  You have white areas in the back of your throat. Get help right away if:  You have severe or persistent: ? Headache. ? Ear pain. ? Sinus pain. ? Chest pain.  You have chronic lung disease and any of the following: ? Wheezing. ? Prolonged cough. ? Coughing up blood. ? A change in your usual mucus.  You have a stiff neck.  You have changes in your: ? Vision. ? Hearing. ? Thinking. ? Mood. This information is not intended to replace advice given to you by your health care provider. Make sure you discuss any questions you have with your health care provider. Document Released: 01/02/2001 Document Revised: 03/11/2016 Document Reviewed: 10/14/2013 Elsevier Interactive Patient Education  2017 [employer]  

## 2016-07-24 DIAGNOSIS — D0592 Unspecified type of carcinoma in situ of left breast: Secondary | ICD-10-CM | POA: Diagnosis not present

## 2016-07-24 DIAGNOSIS — D0512 Intraductal carcinoma in situ of left breast: Secondary | ICD-10-CM | POA: Diagnosis not present

## 2016-07-24 DIAGNOSIS — Z510 Encounter for antineoplastic radiation therapy: Secondary | ICD-10-CM | POA: Diagnosis not present

## 2016-07-25 DIAGNOSIS — D0512 Intraductal carcinoma in situ of left breast: Secondary | ICD-10-CM | POA: Diagnosis not present

## 2016-07-25 DIAGNOSIS — D0592 Unspecified type of carcinoma in situ of left breast: Secondary | ICD-10-CM | POA: Diagnosis not present

## 2016-07-25 DIAGNOSIS — Z510 Encounter for antineoplastic radiation therapy: Secondary | ICD-10-CM | POA: Diagnosis not present

## 2016-07-26 DIAGNOSIS — Z510 Encounter for antineoplastic radiation therapy: Secondary | ICD-10-CM | POA: Diagnosis not present

## 2016-07-26 DIAGNOSIS — D0512 Intraductal carcinoma in situ of left breast: Secondary | ICD-10-CM | POA: Diagnosis not present

## 2016-07-27 DIAGNOSIS — D0512 Intraductal carcinoma in situ of left breast: Secondary | ICD-10-CM | POA: Diagnosis not present

## 2016-07-27 DIAGNOSIS — Z510 Encounter for antineoplastic radiation therapy: Secondary | ICD-10-CM | POA: Diagnosis not present

## 2016-07-30 DIAGNOSIS — Z510 Encounter for antineoplastic radiation therapy: Secondary | ICD-10-CM | POA: Diagnosis not present

## 2016-07-30 DIAGNOSIS — D0512 Intraductal carcinoma in situ of left breast: Secondary | ICD-10-CM | POA: Diagnosis not present

## 2016-07-31 DIAGNOSIS — D0512 Intraductal carcinoma in situ of left breast: Secondary | ICD-10-CM | POA: Diagnosis not present

## 2016-07-31 DIAGNOSIS — Z510 Encounter for antineoplastic radiation therapy: Secondary | ICD-10-CM | POA: Diagnosis not present

## 2016-08-01 ENCOUNTER — Ambulatory Visit: Payer: Self-pay | Admitting: Pediatrics

## 2016-08-01 DIAGNOSIS — D0512 Intraductal carcinoma in situ of left breast: Secondary | ICD-10-CM | POA: Diagnosis not present

## 2016-08-01 DIAGNOSIS — Z510 Encounter for antineoplastic radiation therapy: Secondary | ICD-10-CM | POA: Diagnosis not present

## 2016-08-01 DIAGNOSIS — D0592 Unspecified type of carcinoma in situ of left breast: Secondary | ICD-10-CM | POA: Diagnosis not present

## 2016-08-02 DIAGNOSIS — Z510 Encounter for antineoplastic radiation therapy: Secondary | ICD-10-CM | POA: Diagnosis not present

## 2016-08-02 DIAGNOSIS — D0512 Intraductal carcinoma in situ of left breast: Secondary | ICD-10-CM | POA: Diagnosis not present

## 2016-08-06 DIAGNOSIS — Z510 Encounter for antineoplastic radiation therapy: Secondary | ICD-10-CM | POA: Diagnosis not present

## 2016-08-06 DIAGNOSIS — D0512 Intraductal carcinoma in situ of left breast: Secondary | ICD-10-CM | POA: Diagnosis not present

## 2016-08-07 DIAGNOSIS — D0512 Intraductal carcinoma in situ of left breast: Secondary | ICD-10-CM | POA: Diagnosis not present

## 2016-08-07 DIAGNOSIS — Z510 Encounter for antineoplastic radiation therapy: Secondary | ICD-10-CM | POA: Diagnosis not present

## 2016-08-08 ENCOUNTER — Ambulatory Visit: Payer: Medicare Other | Admitting: Pediatrics

## 2016-08-16 ENCOUNTER — Ambulatory Visit (INDEPENDENT_AMBULATORY_CARE_PROVIDER_SITE_OTHER): Payer: Medicare Other | Admitting: Pediatrics

## 2016-08-16 ENCOUNTER — Encounter: Payer: Self-pay | Admitting: Pediatrics

## 2016-08-16 VITALS — BP 139/78 | HR 62 | Temp 97.3°F | Ht 60.0 in | Wt 111.4 lb

## 2016-08-16 DIAGNOSIS — G4700 Insomnia, unspecified: Secondary | ICD-10-CM | POA: Diagnosis not present

## 2016-08-16 DIAGNOSIS — M810 Age-related osteoporosis without current pathological fracture: Secondary | ICD-10-CM | POA: Diagnosis not present

## 2016-08-16 DIAGNOSIS — I10 Essential (primary) hypertension: Secondary | ICD-10-CM | POA: Diagnosis not present

## 2016-08-16 DIAGNOSIS — R300 Dysuria: Secondary | ICD-10-CM | POA: Diagnosis not present

## 2016-08-16 DIAGNOSIS — F411 Generalized anxiety disorder: Secondary | ICD-10-CM

## 2016-08-16 DIAGNOSIS — Z23 Encounter for immunization: Secondary | ICD-10-CM | POA: Diagnosis not present

## 2016-08-16 LAB — MICROSCOPIC EXAMINATION
BACTERIA UA: NONE SEEN
Epithelial Cells (non renal): >10 /hpf — AB (ref 0–10)
Renal Epithel, UA: NONE SEEN /hpf

## 2016-08-16 LAB — LIPID PANEL
Chol/HDL Ratio: 2.9 ratio units (ref 0.0–4.4)
Cholesterol, Total: 215 mg/dL — ABNORMAL HIGH (ref 100–199)
HDL: 75 mg/dL (ref >39)
LDL Calculated: 121 mg/dL — ABNORMAL HIGH (ref 0–99)
TRIGLYCERIDES: 94 mg/dL (ref 0–149)
VLDL Cholesterol Cal: 19 mg/dL (ref 5–40)

## 2016-08-16 LAB — URINALYSIS, COMPLETE
Bilirubin, UA: NEGATIVE
Glucose, UA: NEGATIVE
Ketones, UA: NEGATIVE
Nitrite, UA: NEGATIVE
PROTEIN UA: NEGATIVE
RBC, UA: NEGATIVE
Specific Gravity, UA: 1.015 (ref 1.005–1.030)
Urobilinogen, Ur: 1 mg/dL (ref 0.2–1.0)
pH, UA: 7.5 (ref 5.0–7.5)

## 2016-08-16 LAB — CMP14+EGFR
A/G RATIO: 1.8 (ref 1.2–2.2)
ALBUMIN: 4.1 g/dL (ref 3.6–4.8)
ALT: 15 IU/L (ref 0–32)
AST: 24 IU/L (ref 0–40)
Alkaline Phosphatase: 39 IU/L (ref 39–117)
BILIRUBIN TOTAL: 1.1 mg/dL (ref 0.0–1.2)
BUN / CREAT RATIO: 11 — AB (ref 12–28)
BUN: 8 mg/dL (ref 8–27)
CHLORIDE: 100 mmol/L (ref 96–106)
CO2: 26 mmol/L (ref 18–29)
Calcium: 9.1 mg/dL (ref 8.7–10.3)
Creatinine, Ser: 0.76 mg/dL (ref 0.57–1.00)
GFR calc non Af Amer: 81 mL/min/{1.73_m2} (ref >59)
GFR, EST AFRICAN AMERICAN: 93 mL/min/{1.73_m2} (ref >59)
Globulin, Total: 2.3 g/dL (ref 1.5–4.5)
Glucose: 81 mg/dL (ref 65–99)
POTASSIUM: 4.3 mmol/L (ref 3.5–5.2)
Sodium: 141 mmol/L (ref 134–144)
TOTAL PROTEIN: 6.4 g/dL (ref 6.0–8.5)

## 2016-08-16 MED ORDER — TEMAZEPAM 15 MG PO CAPS: ORAL_CAPSULE | ORAL | 5 refills | Status: DC

## 2016-08-16 MED ORDER — ALENDRONATE SODIUM 70 MG PO TABS: 70.0000 mg | ORAL_TABLET | ORAL | 3 refills | Status: DC

## 2016-08-16 MED ORDER — LISINOPRIL 10 MG PO TABS: 10.0000 mg | ORAL_TABLET | Freq: Every day | ORAL | 2 refills | Status: DC

## 2016-08-16 MED ORDER — CITALOPRAM HYDROBROMIDE 40 MG PO TABS: 40.0000 mg | ORAL_TABLET | Freq: Every day | ORAL | 1 refills | Status: DC

## 2016-08-16 NOTE — Progress Notes (Addendum)
  Subjective:   Patient ID: Yvonne Rivers, female    DOB: Dec 02, 1945, 71 y.o.   MRN: [id number] CC: Follow-up (6 month); Nasal Congestion; and Sore Throat  HPI: Yvonne Rivers is a 71 y.o. female presenting for Follow-up (6 month); Nasal Congestion; and Sore Throat  Breast cancer: Diagnosed, had surgery, and RTX since last visit Finished radiation 2 weeks ago for breast ca Still feels somewhat tired Think they removed everything with operation  HTN:  No chest pain, no SOB, rare HA No high BPs at home  Neuropathy: feet have been better  Husband has been doing better, still in his well chair  Osteoporosis: on alendronate  Depression: on celexa, has been helping  URI: no fevers Minimal coughing Some runny nose Appetite has been ok  Intermittent burning with urination Improving now  Relevant past medical, surgical, family and social history reviewed. Allergies and medications reviewed and updated. History  Smoking Status  . Never Smoker  Smokeless Tobacco  . Never Used   ROS: Per HPI   Objective:    BP 139/78   Pulse 62   Temp 97.3 F (36.3 C) (Oral)   Ht 5' (1.524 m)   Wt 111 lb 6.4 oz (50.5 kg)   BMI 21.76 kg/m   Wt Readings from Last 3 Encounters:  08/16/16 111 lb 6.4 oz (50.5 kg)  07/20/16 112 lb 12.8 oz (51.2 kg)  03/21/16 113 lb 8 oz (51.5 kg)    Gen: NAD, alert, cooperative with exam, NCAT EYES: EOMI, no conjunctival injection, or no icterus ENT:  TMs pearly gray b/l, OP with mild erythema LYMPH: no cervical LAD CV: NRRR, normal S1/S2, no murmur, distal pulses 2+ b/l Resp: CTABL, no wheezes, normal WOB Abd: +BS, soft, NTND. no guarding or organomegaly Ext: No edema, warm Neuro: Alert and oriented, strength equal b/l UE and LE, coordination grossly normal  Assessment & Plan:  Yvonne was seen today for follow-up, nasal congestion and sore throat.  Diagnoses and all orders for this visit:  Dysuria UA wnl, f/u cx -     Urinalysis, Complete -      Urine culture  Generalized anxiety disorder still some ongoing symptoms though improved Increase to 20m, taking 462msomedays not always now -     citalopram (CELEXA) 40 MG tablet; Take 1 tablet (40 mg total) by mouth daily.  Osteoporosis, unspecified osteoporosis type, unspecified pathological fracture presence Stable, take below -     alendronate (FOSAMAX) 70 MG tablet; Take 1 tablet (70 mg total) by mouth every 7 (seven) days. Take with a full glass of water on an empty stomach.  Essential hypertension, benign Adequate control, cont meds -     lisinopril (PRINIVIL,ZESTRIL) 10 MG tablet; Take 1 tablet (10 mg total) by mouth daily. -     Lipid panel -     CMP14+EGFR  Insomnia, unspecified type Below helps, doesn't take every night Cont for now -     temazepam (RESTORIL) 15 MG capsule; TAKE ONE CAPSULE BY MOUTH ONCE DAILY AT BEDTIME  Encounter for immunization -     Flu Vaccine QUAD 36+ mos IM  Other orders -     Microscopic Examination   Follow up plan: 6 mo [facility]  ADDENDUM: ASCVD 10 yr risk 11.5% Atorvastatin was stopped, cholesterol levels went up Restart atorvastatin

## 2016-08-18 LAB — URINE CULTURE

## 2016-08-20 ENCOUNTER — Other Ambulatory Visit: Payer: Self-pay | Admitting: Pediatrics

## 2016-08-20 DIAGNOSIS — E785 Hyperlipidemia, unspecified: Secondary | ICD-10-CM

## 2016-08-20 MED ORDER — ATORVASTATIN CALCIUM 20 MG PO TABS: 20.0000 mg | ORAL_TABLET | Freq: Every day | ORAL | 3 refills | Status: DC

## 2016-08-30 ENCOUNTER — Other Ambulatory Visit: Payer: Medicare Other

## 2016-08-30 DIAGNOSIS — Z1211 Encounter for screening for malignant neoplasm of colon: Secondary | ICD-10-CM

## 2016-09-01 LAB — FECAL OCCULT BLOOD, IMMUNOCHEMICAL: FECAL OCCULT BLD: NEGATIVE

## 2016-09-13 DIAGNOSIS — Z171 Estrogen receptor negative status [ER-]: Secondary | ICD-10-CM | POA: Diagnosis not present

## 2016-09-13 DIAGNOSIS — Z08 Encounter for follow-up examination after completed treatment for malignant neoplasm: Secondary | ICD-10-CM | POA: Diagnosis not present

## 2016-09-13 DIAGNOSIS — Z853 Personal history of malignant neoplasm of breast: Secondary | ICD-10-CM | POA: Diagnosis not present

## 2016-09-13 DIAGNOSIS — D709 Neutropenia, unspecified: Secondary | ICD-10-CM | POA: Diagnosis not present

## 2016-09-13 DIAGNOSIS — Z86000 Personal history of in-situ neoplasm of breast: Secondary | ICD-10-CM | POA: Diagnosis not present

## 2016-09-13 DIAGNOSIS — L819 Disorder of pigmentation, unspecified: Secondary | ICD-10-CM | POA: Diagnosis not present

## 2016-09-13 DIAGNOSIS — Z9189 Other specified personal risk factors, not elsewhere classified: Secondary | ICD-10-CM | POA: Insufficient documentation

## 2016-12-19 DIAGNOSIS — L819 Disorder of pigmentation, unspecified: Secondary | ICD-10-CM | POA: Diagnosis not present

## 2016-12-19 DIAGNOSIS — R234 Changes in skin texture: Secondary | ICD-10-CM | POA: Diagnosis not present

## 2016-12-19 DIAGNOSIS — M818 Other osteoporosis without current pathological fracture: Secondary | ICD-10-CM | POA: Diagnosis not present

## 2016-12-19 DIAGNOSIS — D0512 Intraductal carcinoma in situ of left breast: Secondary | ICD-10-CM | POA: Diagnosis not present

## 2016-12-19 DIAGNOSIS — Z171 Estrogen receptor negative status [ER-]: Secondary | ICD-10-CM | POA: Diagnosis not present

## 2016-12-19 DIAGNOSIS — Z86000 Personal history of in-situ neoplasm of breast: Secondary | ICD-10-CM | POA: Diagnosis not present

## 2016-12-19 DIAGNOSIS — F5101 Primary insomnia: Secondary | ICD-10-CM | POA: Diagnosis not present

## 2016-12-19 DIAGNOSIS — Z9189 Other specified personal risk factors, not elsewhere classified: Secondary | ICD-10-CM | POA: Diagnosis not present

## 2016-12-19 DIAGNOSIS — Z08 Encounter for follow-up examination after completed treatment for malignant neoplasm: Secondary | ICD-10-CM | POA: Diagnosis not present

## 2017-01-03 DIAGNOSIS — Z853 Personal history of malignant neoplasm of breast: Secondary | ICD-10-CM | POA: Diagnosis not present

## 2017-01-03 DIAGNOSIS — Z08 Encounter for follow-up examination after completed treatment for malignant neoplasm: Secondary | ICD-10-CM | POA: Diagnosis not present

## 2017-02-22 ENCOUNTER — Other Ambulatory Visit: Payer: Self-pay | Admitting: Pediatrics

## 2017-02-22 DIAGNOSIS — G4700 Insomnia, unspecified: Secondary | ICD-10-CM

## 2017-02-25 NOTE — Telephone Encounter (Signed)
Last seen 08/16/16

## 2017-02-25 NOTE — Telephone Encounter (Signed)
Needs to be seen, controlled substance.

## 2017-04-02 ENCOUNTER — Other Ambulatory Visit: Payer: Self-pay | Admitting: Pediatrics

## 2017-04-02 DIAGNOSIS — M1990 Unspecified osteoarthritis, unspecified site: Secondary | ICD-10-CM

## 2017-04-03 NOTE — Telephone Encounter (Signed)
Last seen 08/16/16  Dr Evette Doffing

## 2017-04-05 NOTE — Telephone Encounter (Signed)
Did pt request this? Needs to be seen

## 2017-04-16 ENCOUNTER — Encounter: Payer: Self-pay | Admitting: Pediatrics

## 2017-04-16 ENCOUNTER — Ambulatory Visit (INDEPENDENT_AMBULATORY_CARE_PROVIDER_SITE_OTHER): Payer: Medicare Other | Admitting: Pediatrics

## 2017-04-16 VITALS — BP 145/83 | HR 68 | Temp 97.2°F | Ht 60.0 in | Wt 113.2 lb

## 2017-04-16 DIAGNOSIS — E785 Hyperlipidemia, unspecified: Secondary | ICD-10-CM

## 2017-04-16 DIAGNOSIS — F411 Generalized anxiety disorder: Secondary | ICD-10-CM | POA: Diagnosis not present

## 2017-04-16 DIAGNOSIS — I10 Essential (primary) hypertension: Secondary | ICD-10-CM | POA: Diagnosis not present

## 2017-04-16 DIAGNOSIS — G4700 Insomnia, unspecified: Secondary | ICD-10-CM

## 2017-04-16 DIAGNOSIS — M542 Cervicalgia: Secondary | ICD-10-CM

## 2017-04-16 DIAGNOSIS — M810 Age-related osteoporosis without current pathological fracture: Secondary | ICD-10-CM

## 2017-04-16 MED ORDER — TEMAZEPAM 15 MG PO CAPS
ORAL_CAPSULE | ORAL | 5 refills | Status: DC
Start: 2017-04-16 — End: 2017-10-17

## 2017-04-16 MED ORDER — CITALOPRAM HYDROBROMIDE 40 MG PO TABS: 40.0000 mg | ORAL_TABLET | Freq: Every day | ORAL | 1 refills | Status: DC

## 2017-04-16 MED ORDER — ALENDRONATE SODIUM 70 MG PO TABS: 70.0000 mg | ORAL_TABLET | ORAL | 3 refills | Status: DC

## 2017-04-16 MED ORDER — LISINOPRIL 5 MG PO TABS: 10.0000 mg | ORAL_TABLET | Freq: Every day | ORAL | 2 refills | Status: DC

## 2017-04-16 MED ORDER — ATORVASTATIN CALCIUM 20 MG PO TABS: 20.0000 mg | ORAL_TABLET | Freq: Every day | ORAL | 3 refills | Status: DC

## 2017-04-16 NOTE — Progress Notes (Signed)
  Subjective:   Patient ID: Yvonne Rivers, female    DOB: [birth year], 71 y.o.   MRN: [id number] CC: Follow-up and Shoulder Pain (Right, 2-3 weeks)  HPI: Yvonne Rivers is a 71 y.o. female presenting for Follow-up and Shoulder Pain (Right, 2-3 weeks)  Neck has been bothering her for past few weeks No numbness or tingling in hands, no weakness in hands regulalry has to lift/push to help with moving her husband who has limited mobility  HTN: at home 115/80 this morning Regularly elevated in clinic No CP, no SOB Stopped lisinopril daily a few weeks ago, taking as needed  Depression: thinks citalopram helping some  Has aide coming to help with husband three days a week  HLD: no s/e from lipitor  Insomnia: taking temazepam a few times a week  Osteoporosis: taking fosamax once  week  Relevant past medical, surgical, family and social history reviewed. Allergies and medications reviewed and updated. History  Smoking Status  . Never Smoker  Smokeless Tobacco  . Never Used   ROS: Per HPI   Objective:    BP (!) 145/83   Pulse 68   Temp (!) 97.2 F (36.2 C) (Oral)   Ht 5' (1.524 m)   Wt 113 lb 3.2 oz (51.3 kg)   BMI 22.11 kg/m   Wt Readings from Last 3 Encounters:  04/16/17 113 lb 3.2 oz (51.3 kg)  08/16/16 111 lb 6.4 oz (50.5 kg)  07/20/16 112 lb 12.8 oz (51.2 kg)    Gen: NAD, alert, thin, cooperative with exam, NCAT EYES: EOMI, no conjunctival injection, or no icterus ENT:  TMs pearly gray b/l, OP without erythema LYMPH: no cervical LAD CV: NRRR, normal S1/S2, no murmur Resp: CTABL, no wheezes, normal WOB Abd: +BS, soft, NTND. no guarding or organomegaly Ext: No edema, warm Neuro: Alert and oriented MSK: ttp along muscles R posterior neck Normal ROM neck with turning and tilting b/l Strength 5/5 b/l hand grip Normal int/ext rotation b/l shoulders  Assessment & Plan:  Yvonne Rivers was seen today for follow-up and shoulder pain.  Diagnoses and all orders for this  visit:  Osteoporosis, unspecified osteoporosis type, unspecified pathological fracture presence [place], cont below -     alendronate (FOSAMAX) 70 MG tablet; Take 1 tablet (70 mg total) by mouth every 7 (seven) days. Take with a full glass of water on an empty stomach.  Hyperlipidemia, unspecified hyperlipidemia type Says she had mild elevation of LFTs during treatment for breast cancer and she stopped lisinopril, now taking prn Suspect she was meant to stop the lipitor but as she has been taking it regularly with normalization of her LFTs will continue -     atorvastatin (LIPITOR) 20 MG tablet; Take 1 tablet (20 mg total) by mouth daily.  Generalized anxiety disorder Stable, cont below -     citalopram (CELEXA) 40 MG tablet; Take 1 tablet (40 mg total) by mouth daily.  Essential hypertension, benign Elevated today, off of isinopril 10mg  Decrease to 5mg , check at home, let me know if symptoms -     lisinopril (PRINIVIL,ZESTRIL) 5 MG tablet; Take 2 tablets (10 mg total) by mouth daily.  Insomnia, unspecified type -     temazepam (RESTORIL) 15 MG capsule; TAKE ONE CAPSULE BY MOUTH ONCE DAILY AT BEDTIME  Neck strain Rest, ice, gentle ROM exercises  Follow up plan: Return in about 6 months (around 10/14/2017). Assunta Found, MD [place]

## 2017-09-10 DIAGNOSIS — D0512 Intraductal carcinoma in situ of left breast: Secondary | ICD-10-CM | POA: Diagnosis not present

## 2017-09-10 DIAGNOSIS — R922 Inconclusive mammogram: Secondary | ICD-10-CM | POA: Diagnosis not present

## 2017-10-10 ENCOUNTER — Other Ambulatory Visit: Payer: Self-pay | Admitting: *Deleted

## 2017-10-10 ENCOUNTER — Telehealth: Payer: Self-pay | Admitting: Pediatrics

## 2017-10-10 DIAGNOSIS — I10 Essential (primary) hypertension: Secondary | ICD-10-CM

## 2017-10-10 MED ORDER — LISINOPRIL 5 MG PO TABS: 10.0000 mg | ORAL_TABLET | Freq: Every day | ORAL | 0 refills | Status: DC

## 2017-10-10 NOTE — Telephone Encounter (Signed)
Needs appt to be seen. Overdue for BMP f/u, on bp meds. Call you call to set up appt with me?

## 2017-10-10 NOTE — Telephone Encounter (Signed)
Pt scheduled for 3/28 at 10:30 for follow up.

## 2017-10-17 ENCOUNTER — Encounter: Payer: Self-pay | Admitting: Pediatrics

## 2017-10-17 ENCOUNTER — Ambulatory Visit (INDEPENDENT_AMBULATORY_CARE_PROVIDER_SITE_OTHER): Payer: Medicare Other | Admitting: Pediatrics

## 2017-10-17 VITALS — BP 130/77 | HR 73 | Temp 97.5°F | Ht 60.0 in | Wt 113.0 lb

## 2017-10-17 DIAGNOSIS — Z1159 Encounter for screening for other viral diseases: Secondary | ICD-10-CM | POA: Diagnosis not present

## 2017-10-17 DIAGNOSIS — I10 Essential (primary) hypertension: Secondary | ICD-10-CM

## 2017-10-17 DIAGNOSIS — G4700 Insomnia, unspecified: Secondary | ICD-10-CM

## 2017-10-17 DIAGNOSIS — E785 Hyperlipidemia, unspecified: Secondary | ICD-10-CM

## 2017-10-17 DIAGNOSIS — M810 Age-related osteoporosis without current pathological fracture: Secondary | ICD-10-CM

## 2017-10-17 DIAGNOSIS — F411 Generalized anxiety disorder: Secondary | ICD-10-CM | POA: Diagnosis not present

## 2017-10-17 MED ORDER — CITALOPRAM HYDROBROMIDE 40 MG PO TABS: 40.0000 mg | ORAL_TABLET | Freq: Every day | ORAL | 1 refills | Status: DC

## 2017-10-17 MED ORDER — TEMAZEPAM 15 MG PO CAPS: ORAL_CAPSULE | ORAL | 5 refills | Status: DC

## 2017-10-17 MED ORDER — LISINOPRIL 10 MG PO TABS: 10.0000 mg | ORAL_TABLET | Freq: Every day | ORAL | 1 refills | Status: DC

## 2017-10-17 MED ORDER — CITALOPRAM HYDROBROMIDE 20 MG PO TABS: 20.0000 mg | ORAL_TABLET | Freq: Every day | ORAL | 1 refills | Status: DC

## 2017-10-17 MED ORDER — ATORVASTATIN CALCIUM 20 MG PO TABS: 20.0000 mg | ORAL_TABLET | Freq: Every day | ORAL | 3 refills | Status: DC

## 2017-10-17 MED ORDER — ALENDRONATE SODIUM 70 MG PO TABS: 70.0000 mg | ORAL_TABLET | ORAL | 3 refills | Status: DC

## 2017-10-17 NOTE — Progress Notes (Signed)
  Subjective:   Patient ID: Yvonne Rivers, female    DOB: [birth year], 72 y.o.   MRN: [id number] CC: Follow-up (6 month)  HPI: Yvonne Rivers is a 72 y.o. female presenting for Follow-up (6 month)  Has been treated for breast cancer within the past year.  Most recent mammogram during.  Continuing to follow with oncology.  Overall has been feeling better.  Worries about her husband's health. Taking citalopram off and on.  Says when she takes it it seems to make anxiety worse.  Is not taking it every day.    Osteoporosis: Taking Fosamax.  No side effects.  Hypertension: Taking lisinopril regularly.  No chest pain, shortness of breath.  Relevant past medical, surgical, family and social history reviewed. Allergies and medications reviewed and updated. Social History   Tobacco Use  Smoking Status Never Smoker  Smokeless Tobacco Never Used   ROS: Per HPI   Objective:    BP 130/77   Pulse 73   Temp (!) 97.5 F (36.4 C) (Oral)   Ht 5' (1.524 m)   Wt 113 lb (51.3 kg)   BMI 22.07 kg/m   Wt Readings from Last 3 Encounters:  10/17/17 113 lb (51.3 kg)  04/16/17 113 lb 3.2 oz (51.3 kg)  08/16/16 111 lb 6.4 oz (50.5 kg)    Gen: NAD, alert, cooperative with exam, NCAT EYES: EOMI, no conjunctival injection, or no icterus CV: NRRR, normal S1/S2 Resp: CTABL, no wheezes, normal WOB Abd: +BS, soft, NTND. no guarding or organomegaly Ext: No edema, warm Neuro: Alert and oriented, strength equal b/l UE and LE, coordination grossly normal MSK: normal muscle bulk  Assessment & Plan:  Yvonne Rivers was seen today for follow-up.  Diagnoses and all orders for this visit:  Essential hypertension, benign -     lisinopril (PRINIVIL,ZESTRIL) 10 MG tablet; Take 1 tablet (10 mg total) by mouth daily. -     BMP8+EGFR  Osteoporosis, unspecified osteoporosis type, unspecified pathological fracture presence -     alendronate (FOSAMAX) 70 MG tablet; Take 1 tablet (70 mg total) by mouth every 7 (seven)  days. Take with a full glass of water on an empty stomach.  Hyperlipidemia, unspecified hyperlipidemia type -     atorvastatin (LIPITOR) 20 MG tablet; Take 1 tablet (20 mg total) by mouth daily.  Generalized anxiety disorder Not taking the citalopram daily, this may be why it seems to be making her anxiety worse.  Recommended starting to take it every morning. -     citalopram (CELEXA) 20 MG tablet; Take 1 tablet (20 mg total) by mouth daily.  Insomnia, unspecified type Takes below 2-3 nights a week.  Does not take it every night. -     temazepam (RESTORIL) 15 MG capsule; TAKE ONE CAPSULE BY MOUTH ONCE DAILY AT BEDTIME  Encounter for hepatitis C screening test for low risk patient -     Hepatitis C antibody   Follow up plan: Return in about 6 months (around 04/19/2018). Assunta Found, MD [place]

## 2017-10-18 LAB — BMP8+EGFR
BUN / CREAT RATIO: 15 (ref 12–28)
BUN: 11 mg/dL (ref 8–27)
CHLORIDE: 102 mmol/L (ref 96–106)
CO2: 26 mmol/L (ref 20–29)
Calcium: 9.3 mg/dL (ref 8.7–10.3)
Creatinine, Ser: 0.74 mg/dL (ref 0.57–1.00)
GFR calc Af Amer: 96 mL/min/{1.73_m2} (ref >59)
GFR calc non Af Amer: 83 mL/min/{1.73_m2} (ref >59)
Glucose: 84 mg/dL (ref 65–99)
POTASSIUM: 4.2 mmol/L (ref 3.5–5.2)
Sodium: 141 mmol/L (ref 134–144)

## 2017-10-18 LAB — HEPATITIS C ANTIBODY

## 2017-10-30 ENCOUNTER — Ambulatory Visit: Payer: Medicare Other | Admitting: *Deleted

## 2018-02-24 ENCOUNTER — Other Ambulatory Visit: Payer: Self-pay | Admitting: Pediatrics

## 2018-02-24 DIAGNOSIS — F411 Generalized anxiety disorder: Secondary | ICD-10-CM

## 2018-02-24 DIAGNOSIS — I10 Essential (primary) hypertension: Secondary | ICD-10-CM

## 2018-02-25 NOTE — Telephone Encounter (Signed)
OV 04/21/18

## 2018-04-02 ENCOUNTER — Other Ambulatory Visit: Payer: Self-pay | Admitting: *Deleted

## 2018-04-02 DIAGNOSIS — G4700 Insomnia, unspecified: Secondary | ICD-10-CM

## 2018-04-02 DIAGNOSIS — F411 Generalized anxiety disorder: Secondary | ICD-10-CM

## 2018-04-02 DIAGNOSIS — I10 Essential (primary) hypertension: Secondary | ICD-10-CM

## 2018-04-02 DIAGNOSIS — E785 Hyperlipidemia, unspecified: Secondary | ICD-10-CM

## 2018-04-02 MED ORDER — TEMAZEPAM 15 MG PO CAPS: ORAL_CAPSULE | ORAL | 0 refills | Status: DC

## 2018-04-02 MED ORDER — LISINOPRIL 10 MG PO TABS
10.0000 mg | ORAL_TABLET | Freq: Every day | ORAL | 0 refills | Status: DC
Start: 2018-04-02 — End: 2018-04-21

## 2018-04-02 MED ORDER — ATORVASTATIN CALCIUM 20 MG PO TABS: 20.0000 mg | ORAL_TABLET | Freq: Every day | ORAL | 0 refills | Status: DC

## 2018-04-02 NOTE — Addendum Note (Signed)
Addended by: Antonietta Barcelona D on: 04/02/2018 09:23 AM   Modules accepted: Orders

## 2018-04-02 NOTE — Telephone Encounter (Signed)
Ov 04/21/18

## 2018-04-08 ENCOUNTER — Encounter: Payer: Self-pay | Admitting: *Deleted

## 2018-04-08 ENCOUNTER — Ambulatory Visit (INDEPENDENT_AMBULATORY_CARE_PROVIDER_SITE_OTHER): Payer: Medicare Other | Admitting: *Deleted

## 2018-04-08 ENCOUNTER — Ambulatory Visit (INDEPENDENT_AMBULATORY_CARE_PROVIDER_SITE_OTHER): Payer: Medicare Other

## 2018-04-08 VITALS — BP 136/79 | HR 62 | Ht 59.0 in | Wt 115.0 lb

## 2018-04-08 DIAGNOSIS — Z1211 Encounter for screening for malignant neoplasm of colon: Secondary | ICD-10-CM

## 2018-04-08 DIAGNOSIS — Z1212 Encounter for screening for malignant neoplasm of rectum: Secondary | ICD-10-CM

## 2018-04-08 DIAGNOSIS — Z0000 Encounter for general adult medical examination without abnormal findings: Secondary | ICD-10-CM

## 2018-04-08 DIAGNOSIS — M810 Age-related osteoporosis without current pathological fracture: Secondary | ICD-10-CM

## 2018-04-08 NOTE — Progress Notes (Addendum)
Subjective:   Yvonne Rivers is a 72 y.o. female who presents for a Medicare Annual Wellness Visit. Yvonne Rivers lives at home with her husband who is disabled. He has a right leg amputation and multiple medical conditions. She has an aid that comes in three times a week to help. She has one adult son and one adult daughter and one granddaughter.   Review of Systems    Patient reports that her overall health is unchanged compared to last year.  Cardiac Risk Factors include: advanced age (>85men, >63 women);dyslipidemia;hypertension;family history of premature cardiovascular disease  Musculoskeletal: thoracic back pain. Discussed PT may be an option.   GI: history of colon cancer  All other systems negative       Current Medications (verified) Outpatient Encounter Medications as of 04/08/2018  Medication Sig  . cholecalciferol (VITAMIN D) 1000 UNITS tablet Take 1 tablet (1,000 Units total) by mouth daily.  . fluticasone (FLONASE) 50 MCG/ACT nasal spray Place 2 sprays into both nostrils daily.  . RESTASIS MULTIDOSE 0.05 % ophthalmic emulsion   . [DISCONTINUED] alendronate (FOSAMAX) 70 MG tablet Take 1 tablet (70 mg total) by mouth every 7 (seven) days. Take with a full glass of water on an empty stomach.  . [DISCONTINUED] atorvastatin (LIPITOR) 20 MG tablet Take 1 tablet (20 mg total) by mouth daily.  . [DISCONTINUED] citalopram (CELEXA) 20 MG tablet TAKE 1 TABLET BY MOUTH ONCE DAILY  . [DISCONTINUED] lisinopril (PRINIVIL,ZESTRIL) 10 MG tablet Take 1 tablet (10 mg total) by mouth daily.  . [DISCONTINUED] temazepam (RESTORIL) 15 MG capsule TAKE ONE CAPSULE BY MOUTH ONCE DAILY AT BEDTIME   No facility-administered encounter medications on file as of 04/08/2018.     Allergies (verified) Patient has no known allergies.   History: Past Medical History:  Diagnosis Date  . Arthritis   . Cancer ([place] 05/30/2016   Breast   . Hyperlipidemia   . Hypertension   . Osteoporosis     Past Surgical History:  Procedure Laterality Date  . CHOLECYSTECTOMY    . cyst removed from right breast    . Rotator cuff tear in right shoulder    . TUBAL LIGATION     Family History  Problem Relation Age of Onset  . Heart disease Mother        MI at 53years  . Cancer Father        lung  . Kidney disease Sister   . Diabetes Sister   . Cancer Sister 33       breast cancer  . Heart disease Sister 42       CABG age 24, CHF  . COPD Sister   . Cancer Brother        lung cancer  . Alzheimer's disease Brother 12  . Alzheimer's disease Brother 47  . Heart disease Brother        CHF  . Heart disease Brother 47       CABG  . Cancer Brother 2       leukemia  . Heart disease Brother 10       MI  . Heart disease Brother 78       CABG  . Heart disease Brother        stents  . COPD Brother   . Hypertension Son    Social History   Socioeconomic History  . Marital status: Married    Spouse name: Not on file  . Number of children: 2  .  Years of education: 30  . Highest education level: 12th grade  Occupational History  . Occupation: Retired    Comment: Proofreader  . Financial resource strain: Not hard at all  . Food insecurity:    Worry: Never true    Inability: Never true  . Transportation needs:    Medical: Not on file    Non-medical: Not on file  Tobacco Use  . Smoking status: Never Smoker  . Smokeless tobacco: Never Used  Substance and Sexual Activity  . Alcohol use: Yes    Alcohol/week: 0.0 standard drinks    Comment: rarely   . Drug use: No  . Sexual activity: Not Currently  Lifestyle  . Physical activity:    Days per week: Not on file    Minutes per session: Not on file  . Stress: Not on file  Relationships  . Social connections:    Talks on phone: Not on file    Gets together: Not on file    Attends religious service: Not on file    Active member of club or organization: Not on file    Attends meetings of clubs or organizations:  Not on file    Relationship status: Not on file  Other Topics Concern  . Not on file  Social History Narrative  . Not on file    Tobacco Use No.  Clinical Intake:  Pre-visit preparation completed: No  Pain : No/denies pain     Nutritional Status: BMI of 19-24  Normal Diabetes: No  What is the last grade level you completed in school?: 12  Interpreter Needed?: No  Information entered by :: Chong Sicilian, RN   Activities of Daily Living In your present state of health, do you have any difficulty performing the following activities: 04/08/2018  Hearing? N  Vision? N  Difficulty concentrating or making decisions? N  Walking or climbing stairs? N  Dressing or bathing? N  Doing errands, shopping? N  Preparing Food and eating ? N  Using the Toilet? N  In the past six months, have you accidently leaked urine? N  Do you have problems with loss of bowel control? N  Managing your Medications? N  Managing your Finances? N  Housekeeping or managing your Housekeeping? N  Some recent data might be hidden     Diet Most meals at home. Does eat out some.  Exercise Current Exercise Habits: The patient does not participate in regular exercise at present(busy around home and has to care for husband), Type of exercise: walking;strength training/weights, Time (Minutes): 30, Frequency (Times/Week): 3, Weekly Exercise (Minutes/Week): 90, Intensity: Moderate, Exercise limited by: None identified   Depression Screen PHQ 2/9 Scores 04/21/2018 04/08/2018 10/17/2017 10/17/2017 04/16/2017 08/16/2016 07/20/2016  PHQ - 2 Score 2 2 2  0 0 0 0  PHQ- 9 Score 6 6 5  - - - -     Fall Risk Fall Risk  04/21/2018 04/08/2018 10/17/2017 10/17/2017 04/16/2017  Falls in the past year? No No Yes No No  Comment - - - - -  Number falls in past yr: - - 1 - -  Injury with Fall? - - Yes - -  Follow up - - - - -    Safety Is the patient's home free of loose throw rugs in walkways, pet beds, electrical cords,  etc?   yes      Handrails on the stairs?   yes      Adequate lighting?   yes  Patient  Care Team: Eustaquio Maize, MD as PCP - General (Pediatrics)  Hospitalizations, surgeries, and ER visits in previous 12 months No hospitalizations, ER visits, or surgeries this past year.   Objective:    Today's Vitals   04/08/18 1007  BP: 136/79  Pulse: 62  Weight: 115 lb (52.2 kg)  Height: 4\' 11"  (1.499 m)   Body mass index is 23.23 kg/m.  Advanced Directives 04/08/2018 03/21/2016 06/01/2015 11/24/2014  Does Patient Have a Medical Advance Directive? No No No No  Would patient like information on creating a medical advance directive? Yes (MAU/Ambulatory/Procedural Areas - Information given) Yes - Educational materials given No - patient declined information Yes - Educational materials given    Hearing/Vision  No hearing or vision deficits noted during visit.  Cognitive Function: MMSE - Mini Mental State Exam 04/08/2018 03/21/2016 11/25/2014  Orientation to time 4 5 5   Orientation to Place 5 5 5   Registration 3 3 3   Attention/ Calculation 2 4 0  Recall 0 3 3  Language- name 2 objects 2 2 2   Language- repeat 1 1 1   Language- follow 3 step command 3 3 3   Language- read & follow direction 1 1 1   Write a sentence 1 1 1   Copy design 1 1 1   Total score 23 29 25        Normal Cognitive Function Screening: Unsure-no significant difference from previous    Immunizations and Health Maintenance Immunization History  Administered Date(s) Administered  . Influenza, High Dose Seasonal PF 04/21/2018  . Influenza,inj,Quad PF,6+ Mos 06/01/2014, 06/01/2015, 08/16/2016  . Pneumococcal Conjugate-13 03/21/2016  . Pneumococcal Polysaccharide-23 06/01/2014   Health Maintenance Due  Topic Date Due  . Fecal DNA (Cologuard)  04/01/1998  . MAMMOGRAM  04/16/2018   Health Maintenance  Topic Date Due  . Fecal DNA (Cologuard)  04/01/1998  . MAMMOGRAM  04/16/2018  . INFLUENZA VACCINE  06/16/2018  (Originally 02/20/2018)  . TETANUS/TDAP  06/02/2019  . DEXA SCAN  04/08/2020  . Hepatitis C Screening  Completed  . PNA vac Low Risk Adult  Completed        Assessment:   This is a routine wellness examination for [place].    Plan:    Goals    . Exercise 150 min/wk Moderate Activity       Additional Screening Recommendations: Lung: Low Dose CT Chest recommended if Age 47-80 years, 30 pack-year currently smoking OR have quit w/in 15years. Patient does not qualify. Hepatitis C Screening recommended: no  Today's Orders Orders Placed This Encounter  Procedures  . DG [facility] DEXA    Order Specific Question:   Reason for Exam (SYMPTOM  OR DIAGNOSIS REQUIRED)    Answer:   osteoprosis  . Cologuard    Keep f/u with Eustaquio Maize, MD and any other specialty appointments you may have Continue current medications Move carefully to avoid falls. Use assistive devices like a cane or walker if needed. Aim for at least 150 minutes of moderate activity a week. This can be done with chair exercises if necessary. Read or work on puzzles daily Stay connected with friends and family Treadmill recommended due to family history of heart disease Mammogram scheduled for Thursday. Has a personal hx of colon cancer Advanced directives given and discussed. Patient will return a signed/notarized copy.   I have personally reviewed and noted the following in the patient's chart:   . Medical and social history . Use of alcohol, tobacco or illicit drugs  . Current medications  and supplements . Functional ability and status . Nutritional status . Physical activity . Advanced directives . List of other physicians . Hospitalizations, surgeries, and ER visits in previous 12 months . Vitals . Screenings to include cognitive, depression, and falls . Referrals and appointments  In addition, I have reviewed and discussed with patient certain preventive protocols, quality metrics, and best practice  recommendations. A written personalized care plan for preventive services as well as general preventive health recommendations were provided to patient.     Chong Sicilian, RN   04/08/18

## 2018-04-08 NOTE — Patient Instructions (Addendum)
  Yvonne Rivers , Thank you for taking time to come for your Medicare Wellness Visit. I appreciate your ongoing commitment to your health goals. Please review the following plan we discussed and let me know if I can assist you in the future.   These are the goals we discussed: Goals    . Exercise 150 min/wk Moderate Activity       This is a list of the screening recommended for you and due dates:  Health Maintenance  Topic Date Due  . Cologuard (Stool DNA test)  04/01/1998  . DEXA scan (bone density measurement)  06/09/2016  . Flu Shot  06/16/2018*  . Mammogram  04/16/2018  . Tetanus Vaccine  06/02/2019  .  Hepatitis C: One time screening is recommended by Center for Disease Control  (CDC) for  adults born from [birth year] through 1965.   Completed  . Pneumonia vaccines  Completed  *Topic was postponed. The date shown is not the original due date.     Your doctor has prescribed Cologuard, an easy-to-use, noninvasive test for colon cancer screening, based on the latest advances in stool DNA science.   Here's what will happen next:  1. You may receive a call or email from [facility] to confirm your mailing address and insurance information 2. Your kit will be shipped directly to you 3. You collet your stool sample in the privacy of your own home 4. You return the kit via [facility] shipping or pick-up, in the same box it arrived in 5. You doctor will contact you with the results once they are available  Screening for colon cancer is very important to your good health, so if you have any questions at all, please call [employer] [facility]'s Customer Support Specialists at [phone]. They are available 24 hours a day, 6 days a week.

## 2018-04-17 ENCOUNTER — Telehealth: Payer: Self-pay | Admitting: Pediatrics

## 2018-04-17 NOTE — Telephone Encounter (Signed)
Aware will review at appt mon / or wed.

## 2018-04-21 ENCOUNTER — Ambulatory Visit (INDEPENDENT_AMBULATORY_CARE_PROVIDER_SITE_OTHER): Payer: Medicare Other | Admitting: Pediatrics

## 2018-04-21 ENCOUNTER — Encounter: Payer: Self-pay | Admitting: Pediatrics

## 2018-04-21 DIAGNOSIS — K449 Diaphragmatic hernia without obstruction or gangrene: Secondary | ICD-10-CM | POA: Diagnosis not present

## 2018-04-21 DIAGNOSIS — G4700 Insomnia, unspecified: Secondary | ICD-10-CM | POA: Diagnosis not present

## 2018-04-21 DIAGNOSIS — Z23 Encounter for immunization: Secondary | ICD-10-CM

## 2018-04-21 DIAGNOSIS — K529 Noninfective gastroenteritis and colitis, unspecified: Secondary | ICD-10-CM | POA: Diagnosis not present

## 2018-04-21 DIAGNOSIS — R110 Nausea: Secondary | ICD-10-CM | POA: Diagnosis not present

## 2018-04-21 DIAGNOSIS — E785 Hyperlipidemia, unspecified: Secondary | ICD-10-CM | POA: Diagnosis not present

## 2018-04-21 DIAGNOSIS — M810 Age-related osteoporosis without current pathological fracture: Secondary | ICD-10-CM | POA: Diagnosis not present

## 2018-04-21 DIAGNOSIS — K5730 Diverticulosis of large intestine without perforation or abscess without bleeding: Secondary | ICD-10-CM | POA: Diagnosis not present

## 2018-04-21 DIAGNOSIS — I959 Hypotension, unspecified: Secondary | ICD-10-CM | POA: Diagnosis not present

## 2018-04-21 DIAGNOSIS — R1084 Generalized abdominal pain: Secondary | ICD-10-CM | POA: Diagnosis not present

## 2018-04-21 DIAGNOSIS — I10 Essential (primary) hypertension: Secondary | ICD-10-CM

## 2018-04-21 DIAGNOSIS — Z79899 Other long term (current) drug therapy: Secondary | ICD-10-CM | POA: Diagnosis not present

## 2018-04-21 DIAGNOSIS — R109 Unspecified abdominal pain: Secondary | ICD-10-CM | POA: Diagnosis not present

## 2018-04-21 DIAGNOSIS — F411 Generalized anxiety disorder: Secondary | ICD-10-CM | POA: Diagnosis not present

## 2018-04-21 DIAGNOSIS — N3000 Acute cystitis without hematuria: Secondary | ICD-10-CM | POA: Diagnosis not present

## 2018-04-21 DIAGNOSIS — K838 Other specified diseases of biliary tract: Secondary | ICD-10-CM | POA: Diagnosis not present

## 2018-04-21 DIAGNOSIS — R112 Nausea with vomiting, unspecified: Secondary | ICD-10-CM | POA: Diagnosis not present

## 2018-04-21 MED ORDER — ATORVASTATIN CALCIUM 20 MG PO TABS: 20.0000 mg | ORAL_TABLET | Freq: Every day | ORAL | 3 refills | Status: DC

## 2018-04-21 MED ORDER — LISINOPRIL 10 MG PO TABS: 10.0000 mg | ORAL_TABLET | Freq: Every day | ORAL | 3 refills | Status: DC

## 2018-04-21 MED ORDER — ALENDRONATE SODIUM 70 MG PO TABS: 70.0000 mg | ORAL_TABLET | ORAL | 3 refills | Status: DC

## 2018-04-21 MED ORDER — SERTRALINE HCL 25 MG PO TABS: ORAL_TABLET | ORAL | 5 refills | Status: DC

## 2018-04-21 MED ORDER — TEMAZEPAM 15 MG PO CAPS: ORAL_CAPSULE | ORAL | 5 refills | Status: DC

## 2018-04-21 NOTE — Progress Notes (Signed)
  Subjective:   Patient ID: Judie Bonus, female    DOB: Sep 07, 1945, 72 y.o.   MRN: [id number] CC: Medical Management of Chronic Issues (6 month)  HPI: AARTI MANKOWSKI is a 72 y.o. female   Osteoporosis: taking vitamin D and calcium OTC daily. Has been on alendronate since 2016.  HTN: taking lisinopril regularly  Insomnia: Takes temazepam intermittently to help with sleep, usually several days a week.  Anxiety: Taking citalopram daily.  She thinks it makes her feel jittery.  Asked about switching to alternate medicine.  Ongoing stress with husband's illness, caring for him.  Relevant past medical, surgical, family and social history reviewed. Allergies and medications reviewed and updated. Social History   Tobacco Use  Smoking Status Never Smoker  Smokeless Tobacco Never Used   ROS: Per HPI   Objective:    BP 132/86   Pulse 67   Temp 98 F (36.7 C) (Oral)   Ht 4\' 11"  (1.499 m)   Wt 112 lb 6.4 oz (51 kg)   BMI 22.70 kg/m   Wt Readings from Last 3 Encounters:  04/21/18 112 lb 6.4 oz (51 kg)  04/08/18 115 lb (52.2 kg)  10/17/17 113 lb (51.3 kg)   Gen: NAD, alert, cooperative with exam, NCAT EYES: EOMI, no conjunctival injection, or no icterus ENT:  TMs pearly gray b/l, OP without erythema LYMPH: no cervical LAD CV: NRRR, normal S1/S2, no murmur, distal pulses 2+ b/l Resp: CTABL, no wheezes, normal WOB Abd: +BS, soft, NTND. Ext: No edema, warm Neuro: Alert and oriented, strength equal b/l UE and LE, coordination grossly normal MSK: normal muscle bulk  Assessment & Plan:  Miciah was seen today for medical management of chronic issues.  Diagnoses and all orders for this visit:  Insomnia, unspecified type Stable on below, no major side effects.  Has been on for some years.  Will continue for now.  Continue to wean as able, do not take it if not needed.  Not taking every day. -     temazepam (RESTORIL) 15 MG capsule; TAKE ONE CAPSULE BY MOUTH ONCE DAILY AT  BEDTIME  Essential hypertension, benign Stable, continue below. -     lisinopril (PRINIVIL,ZESTRIL) 10 MG tablet; Take 1 tablet (10 mg total) by mouth daily. -     Basic Metabolic Panel  Generalized anxiety disorder Some jitteriness on citalopram.  Will stop citalopram.  Start low-dose sertraline.  Take half tab 3 days and take full tab.  Take every day.  Follow-up in 3 months. -     sertraline (ZOLOFT) 25 MG tablet; Half tab for first 8 days then full tab once daily in morning  Hyperlipidemia, unspecified hyperlipidemia type Stable, continue below -     atorvastatin (LIPITOR) 20 MG tablet; Take 1 tablet (20 mg total) by mouth daily.  Osteoporosis, unspecified osteoporosis type, unspecified pathological fracture presence Stable, continue below -     alendronate (FOSAMAX) 70 MG tablet; Take 1 tablet (70 mg total) by mouth every 7 (seven) days. Take with a full glass of water on an empty stomach. -     VITAMIN D 25 Hydroxy (Vit-D Deficiency, Fractures)  Encounter for immunization -     Flu vaccine HIGH DOSE PF   Follow up plan: Return in about 3 months (around 07/21/2018). Assunta Found, MD [place]

## 2018-04-22 LAB — BASIC METABOLIC PANEL
BUN / CREAT RATIO: 12 (ref 12–28)
BUN: 10 mg/dL (ref 8–27)
CHLORIDE: 100 mmol/L (ref 96–106)
CO2: 26 mmol/L (ref 20–29)
Calcium: 9.4 mg/dL (ref 8.7–10.3)
Creatinine, Ser: 0.81 mg/dL (ref 0.57–1.00)
GFR calc Af Amer: 85 mL/min/{1.73_m2} (ref >59)
GFR calc non Af Amer: 74 mL/min/{1.73_m2} (ref >59)
GLUCOSE: 75 mg/dL (ref 65–99)
POTASSIUM: 4.3 mmol/L (ref 3.5–5.2)
SODIUM: 141 mmol/L (ref 134–144)

## 2018-04-22 LAB — VITAMIN D 25 HYDROXY (VIT D DEFICIENCY, FRACTURES): VIT D 25 HYDROXY: 43.9 ng/mL (ref 30.0–100.0)

## 2018-04-23 ENCOUNTER — Ambulatory Visit: Payer: Medicare Other | Admitting: Pharmacist Clinician (PhC)/ Clinical Pharmacy Specialist

## 2018-04-30 DIAGNOSIS — D0512 Intraductal carcinoma in situ of left breast: Secondary | ICD-10-CM | POA: Diagnosis not present

## 2018-04-30 DIAGNOSIS — R928 Other abnormal and inconclusive findings on diagnostic imaging of breast: Secondary | ICD-10-CM | POA: Diagnosis not present

## 2018-04-30 DIAGNOSIS — R922 Inconclusive mammogram: Secondary | ICD-10-CM | POA: Diagnosis not present

## 2018-05-06 DIAGNOSIS — Z1211 Encounter for screening for malignant neoplasm of colon: Secondary | ICD-10-CM | POA: Diagnosis not present

## 2018-05-06 DIAGNOSIS — Z1212 Encounter for screening for malignant neoplasm of rectum: Secondary | ICD-10-CM | POA: Diagnosis not present

## 2018-05-14 ENCOUNTER — Ambulatory Visit: Payer: Medicare Other | Admitting: Pharmacist Clinician (PhC)/ Clinical Pharmacy Specialist

## 2018-05-18 LAB — COLOGUARD: COLOGUARD: POSITIVE

## 2018-05-23 DIAGNOSIS — I10 Essential (primary) hypertension: Secondary | ICD-10-CM | POA: Diagnosis not present

## 2018-05-23 DIAGNOSIS — Z9189 Other specified personal risk factors, not elsewhere classified: Secondary | ICD-10-CM | POA: Diagnosis not present

## 2018-05-23 DIAGNOSIS — D0512 Intraductal carcinoma in situ of left breast: Secondary | ICD-10-CM | POA: Diagnosis not present

## 2018-05-23 DIAGNOSIS — N3000 Acute cystitis without hematuria: Secondary | ICD-10-CM | POA: Diagnosis not present

## 2018-05-23 DIAGNOSIS — E876 Hypokalemia: Secondary | ICD-10-CM | POA: Diagnosis not present

## 2018-05-23 DIAGNOSIS — D709 Neutropenia, unspecified: Secondary | ICD-10-CM | POA: Diagnosis not present

## 2018-05-23 DIAGNOSIS — M818 Other osteoporosis without current pathological fracture: Secondary | ICD-10-CM | POA: Diagnosis not present

## 2018-05-29 ENCOUNTER — Encounter: Payer: Self-pay | Admitting: *Deleted

## 2018-05-30 ENCOUNTER — Other Ambulatory Visit: Payer: Self-pay

## 2018-05-30 DIAGNOSIS — R195 Other fecal abnormalities: Secondary | ICD-10-CM

## 2018-07-24 ENCOUNTER — Ambulatory Visit: Payer: Medicare Other | Admitting: Pediatrics

## 2018-07-28 ENCOUNTER — Ambulatory Visit (INDEPENDENT_AMBULATORY_CARE_PROVIDER_SITE_OTHER): Payer: Medicare Other | Admitting: Pediatrics

## 2018-07-28 ENCOUNTER — Encounter: Payer: Self-pay | Admitting: Pediatrics

## 2018-07-28 VITALS — BP 134/89 | HR 91 | Temp 97.1°F | Ht 59.0 in | Wt 114.4 lb

## 2018-07-28 DIAGNOSIS — E785 Hyperlipidemia, unspecified: Secondary | ICD-10-CM

## 2018-07-28 DIAGNOSIS — M810 Age-related osteoporosis without current pathological fracture: Secondary | ICD-10-CM

## 2018-07-28 DIAGNOSIS — I10 Essential (primary) hypertension: Secondary | ICD-10-CM | POA: Diagnosis not present

## 2018-07-28 DIAGNOSIS — G4700 Insomnia, unspecified: Secondary | ICD-10-CM | POA: Diagnosis not present

## 2018-07-28 DIAGNOSIS — R195 Other fecal abnormalities: Secondary | ICD-10-CM

## 2018-07-28 DIAGNOSIS — F411 Generalized anxiety disorder: Secondary | ICD-10-CM | POA: Diagnosis not present

## 2018-07-28 MED ORDER — TEMAZEPAM 15 MG PO CAPS: ORAL_CAPSULE | ORAL | 5 refills | Status: DC

## 2018-07-28 MED ORDER — SERTRALINE HCL 25 MG PO TABS: ORAL_TABLET | ORAL | 5 refills | Status: DC

## 2018-07-28 NOTE — Progress Notes (Signed)
  Subjective:   Patient ID: Yvonne Rivers, female    DOB: [birth year], 73 y.o.   MRN: [id number] CC: Medical Management of Chronic Issues (3 month)  HPI: Yvonne Rivers is a 73 y.o. female   Insomnia: taking temazepam about every other day. Husband's health poor, aide and daughter helping   Anxiety: Taking SSRI irregularly, few days a week.  Gets jittery on the days that she takes it.  Is not taking it consistently daily for more than a week yet.  Recent positive Cologuard.  Hypertension: Taking medicine regularly.  No lightheadedness or dizziness.  No chest pain or shortness of breath  Hyperlipidemia: Tolerating statin  Osteoporosis: Taking alendronate weekly.  Relevant past medical, surgical, family and social history reviewed. Allergies and medications reviewed and updated. Social History   Tobacco Use  Smoking Status Never Smoker  Smokeless Tobacco Never Used   ROS: Per HPI   Objective:    BP 134/89   Pulse 91   Temp (!) 97.1 F (36.2 C) (Oral)   Ht 4\' 11"  (1.499 m)   Wt 114 lb 6.4 oz (51.9 kg)   BMI 23.11 kg/m   Wt Readings from Last 3 Encounters:  07/28/18 114 lb 6.4 oz (51.9 kg)  04/21/18 112 lb 6.4 oz (51 kg)  04/08/18 115 lb (52.2 kg)    Gen: NAD, alert, cooperative with exam, NCAT EYES: EOMI, no conjunctival injection, or no icterus ENT:  TMs pearly gray b/l, OP without erythema LYMPH: no cervical LAD CV: NRRR, normal S1/S2, no murmur, distal pulses 2+ b/l Resp: CTABL, no wheezes, normal WOB Abd: +BS, soft, NTND. no guarding or organomegaly Ext: No edema, warm Neuro: Alert and oriented, strength equal b/l UE and LE, coordination grossly normal MSK: normal muscle bulk  Assessment & Plan:  Yvonne Rivers was seen today for medical management of chronic issues.  Diagnoses and all orders for this visit:  Essential hypertension, benign Stable, continue current med  Insomnia, unspecified type Stable, continue below as needed.  Continue to limit use as  able. -     temazepam (RESTORIL) 15 MG capsule; TAKE ONE CAPSULE BY MOUTH ONCE DAILY AT BEDTIME  Generalized anxiety disorder Discussed options, some ongoing symptoms with stress at home.  Will try taking sertraline every day half tab.  During is likely to improve after he gets in her system. -     sertraline (ZOLOFT) 25 MG tablet; Half tab for first 8 days then full tab once daily in morning  Positive colorectal cancer screening using Cologuard test -     Ambulatory referral to Gastroenterology  Hyperlipidemia, unspecified hyperlipidemia type Stable, continue statin  Osteoporosis, unspecified osteoporosis type, unspecified pathological fracture presence Stable, continue bisphosphonate  Follow up plan: Return in about 6 months (around 01/26/2019). Assunta Found, MD [place]

## 2018-09-10 DIAGNOSIS — R921 Mammographic calcification found on diagnostic imaging of breast: Secondary | ICD-10-CM | POA: Diagnosis not present

## 2018-09-10 DIAGNOSIS — D0512 Intraductal carcinoma in situ of left breast: Secondary | ICD-10-CM | POA: Diagnosis not present

## 2018-09-25 ENCOUNTER — Telehealth: Payer: Self-pay | Admitting: *Deleted

## 2018-09-25 NOTE — Telephone Encounter (Signed)
Called patient regarding positive cologuard in October 2019.  Referral placed for patient to have colonoscopy in January 2020.  Patient declined appointment and also states that she just did another cologuard with [employer] came out and would like for results to come back before she does a colonoscopy.  Strongly advised  patient that with her history of breast cancer and one positive cologuard she needed to have colonoscopy.  Patient is persistent on waiting on most recent cologuard results.

## 2018-11-03 ENCOUNTER — Other Ambulatory Visit: Payer: Self-pay

## 2018-11-03 DIAGNOSIS — M810 Age-related osteoporosis without current pathological fracture: Secondary | ICD-10-CM

## 2018-11-03 MED ORDER — ALENDRONATE SODIUM 70 MG PO TABS: 70.0000 mg | ORAL_TABLET | ORAL | 0 refills | Status: DC

## 2018-11-05 ENCOUNTER — Other Ambulatory Visit: Payer: Self-pay | Admitting: *Deleted

## 2018-11-05 DIAGNOSIS — I10 Essential (primary) hypertension: Secondary | ICD-10-CM

## 2018-11-05 DIAGNOSIS — M810 Age-related osteoporosis without current pathological fracture: Secondary | ICD-10-CM

## 2018-11-05 DIAGNOSIS — G4700 Insomnia, unspecified: Secondary | ICD-10-CM

## 2018-11-05 DIAGNOSIS — F411 Generalized anxiety disorder: Secondary | ICD-10-CM

## 2018-11-05 DIAGNOSIS — E785 Hyperlipidemia, unspecified: Secondary | ICD-10-CM

## 2018-11-05 MED ORDER — TEMAZEPAM 15 MG PO CAPS: ORAL_CAPSULE | ORAL | 0 refills | Status: DC

## 2018-11-05 MED ORDER — ALENDRONATE SODIUM 70 MG PO TABS: 70.0000 mg | ORAL_TABLET | ORAL | 0 refills | Status: DC

## 2018-11-05 MED ORDER — ATORVASTATIN CALCIUM 20 MG PO TABS: 20.0000 mg | ORAL_TABLET | Freq: Every day | ORAL | 0 refills | Status: DC

## 2018-11-05 MED ORDER — SERTRALINE HCL 25 MG PO TABS: 25.0000 mg | ORAL_TABLET | Freq: Every day | ORAL | 0 refills | Status: DC

## 2018-11-05 MED ORDER — LISINOPRIL 10 MG PO TABS: 10.0000 mg | ORAL_TABLET | Freq: Every day | ORAL | 0 refills | Status: DC

## 2018-12-23 ENCOUNTER — Other Ambulatory Visit: Payer: Self-pay | Admitting: *Deleted

## 2018-12-23 DIAGNOSIS — I10 Essential (primary) hypertension: Secondary | ICD-10-CM

## 2018-12-23 DIAGNOSIS — F411 Generalized anxiety disorder: Secondary | ICD-10-CM

## 2018-12-23 DIAGNOSIS — M810 Age-related osteoporosis without current pathological fracture: Secondary | ICD-10-CM

## 2018-12-23 DIAGNOSIS — G4700 Insomnia, unspecified: Secondary | ICD-10-CM

## 2018-12-23 DIAGNOSIS — E785 Hyperlipidemia, unspecified: Secondary | ICD-10-CM

## 2018-12-23 MED ORDER — SERTRALINE HCL 25 MG PO TABS: 25.0000 mg | ORAL_TABLET | Freq: Every day | ORAL | 0 refills | Status: DC

## 2018-12-23 MED ORDER — ALENDRONATE SODIUM 70 MG PO TABS: 70.0000 mg | ORAL_TABLET | ORAL | 0 refills | Status: DC

## 2018-12-23 MED ORDER — TEMAZEPAM 15 MG PO CAPS: ORAL_CAPSULE | ORAL | 0 refills | Status: DC

## 2018-12-23 MED ORDER — ATORVASTATIN CALCIUM 20 MG PO TABS: 20.0000 mg | ORAL_TABLET | Freq: Every day | ORAL | 0 refills | Status: DC

## 2018-12-23 MED ORDER — LISINOPRIL 10 MG PO TABS: 10.0000 mg | ORAL_TABLET | Freq: Every day | ORAL | 0 refills | Status: DC

## 2018-12-23 NOTE — Addendum Note (Signed)
Addended by: Antonietta Barcelona D on: 12/23/2018 10:15 AM   Modules accepted: Orders

## 2018-12-23 NOTE — Telephone Encounter (Signed)
NexOV 01/27/19

## 2019-01-01 ENCOUNTER — Telehealth: Payer: Self-pay | Admitting: Family Medicine

## 2019-01-01 NOTE — Telephone Encounter (Signed)
Pt aware refills sent in on 12/23/18, this maybe a mix up. Will let us know if they do not get medication in mail by Monday

## 2019-01-01 NOTE — Telephone Encounter (Signed)
[facility]  Pt daughter has called states that she received email from [facility] stating that they are waiting on [place] to refill medicines.

## 2019-01-06 ENCOUNTER — Other Ambulatory Visit: Payer: Self-pay | Admitting: *Deleted

## 2019-01-06 DIAGNOSIS — G4700 Insomnia, unspecified: Secondary | ICD-10-CM

## 2019-01-06 NOTE — Telephone Encounter (Signed)
Returned pt's VM, [facility] has not received refill request on pt's Temazepam.This was RFd on 12/23/18 but printed instead of e-scribe

## 2019-01-07 MED ORDER — TEMAZEPAM 15 MG PO CAPS: ORAL_CAPSULE | ORAL | 0 refills | Status: DC

## 2019-01-07 NOTE — Addendum Note (Signed)
Addended by: Marylin Crosby on: 01/07/2019 09:00 AM   Modules accepted: Orders

## 2019-01-07 NOTE — Telephone Encounter (Signed)
Pt still needs this sent but it has to be sent over by provider, if nurse does it then it will print and not go over electronically.

## 2019-01-07 NOTE — Telephone Encounter (Signed)
Does this still need to be sent? If so, let me know or go ahead and send it. Thanks.

## 2019-01-27 ENCOUNTER — Ambulatory Visit: Payer: Medicare Other | Admitting: Family Medicine

## 2019-01-30 ENCOUNTER — Other Ambulatory Visit: Payer: Self-pay

## 2019-02-02 ENCOUNTER — Ambulatory Visit (INDEPENDENT_AMBULATORY_CARE_PROVIDER_SITE_OTHER): Payer: Medicare Other | Admitting: Family Medicine

## 2019-02-02 ENCOUNTER — Encounter: Payer: Self-pay | Admitting: Family Medicine

## 2019-02-02 DIAGNOSIS — I10 Essential (primary) hypertension: Secondary | ICD-10-CM | POA: Diagnosis not present

## 2019-02-02 DIAGNOSIS — F411 Generalized anxiety disorder: Secondary | ICD-10-CM

## 2019-02-02 DIAGNOSIS — G4701 Insomnia due to medical condition: Secondary | ICD-10-CM

## 2019-02-02 DIAGNOSIS — Z8619 Personal history of other infectious and parasitic diseases: Secondary | ICD-10-CM

## 2019-02-02 DIAGNOSIS — E785 Hyperlipidemia, unspecified: Secondary | ICD-10-CM

## 2019-02-02 DIAGNOSIS — K047 Periapical abscess without sinus: Secondary | ICD-10-CM

## 2019-02-02 MED ORDER — AMOXICILLIN-POT CLAVULANATE 875-125 MG PO TABS: 1.0000 | ORAL_TABLET | Freq: Two times a day (BID) | ORAL | 0 refills | Status: AC

## 2019-02-02 MED ORDER — FLUCONAZOLE 150 MG PO TABS: 150.0000 mg | ORAL_TABLET | Freq: Once | ORAL | 1 refills | Status: AC

## 2019-02-02 MED ORDER — SERTRALINE HCL 50 MG PO TABS: 50.0000 mg | ORAL_TABLET | Freq: Every day | ORAL | 3 refills | Status: DC

## 2019-02-02 NOTE — Progress Notes (Signed)
Virtual Visit via telephone Note Due to COVID-19, visit is conducted virtually and was requested by patient. This visit type was conducted due to national recommendations for restrictions regarding the COVID-19 Pandemic (e.g. social distancing) in an effort to limit this patient's exposure and mitigate transmission in our community. All issues noted in this document were discussed and addressed.  A physical exam was not performed with this format.   I connected with Yvonne Rivers on 02/02/19 at 1200 by telephone and verified that I am speaking with the correct person using two identifiers. Yvonne Chafe [place] is currently located at home and family is currently with them during visit. The provider, Monia Pouch, FNP is located in their office at time of visit.  I discussed the limitations, risks, security and privacy concerns of performing an evaluation and management service by telephone and the availability of in person appointments. I also discussed with the patient that there may be a patient responsible charge related to this service. The patient expressed understanding and agreed to proceed.  Subjective:  Patient ID: Yvonne Rivers, female    DOB: 04-04-1946, 73 y.o.   MRN: [id number]  Chief Complaint:  Medical Management of Chronic Issues and Dental Pain   HPI: Yvonne Rivers is a 73 y.o. female presenting on 02/02/2019 for Medical Management of Chronic Issues and Dental Pain   Pt presents today for management of chronic medical problems. Pt states she has been doing fairly well overall. States she has been taking care of her disabled husband for over 6 years and this has caused her anxiety to increase immensely over the last few months. She states it is getting harder to care for him without assistance. She also reports a right lower molar dental infection. States she has been unable to see a dentist due to COVID-19. States she has swelling and redness of the gum and slight swelling to  her right lower jaw. No trouble chewing or loose teeth. No jaw pain, fever, chills, fatigue, or weakness.   Dental Pain  This is a new problem. The current episode started 1 to 4 weeks ago. The problem occurs constantly. The problem has been waxing and waning. The pain is at a severity of 4/10. The pain is mild. Associated symptoms include facial pain, oral bleeding and thermal sensitivity. Pertinent negatives include no difficulty swallowing, fever or sinus pressure. She has tried acetaminophen for the symptoms. The treatment provided mild relief.  Hypertension This is a chronic problem. The current episode started more than 1 year ago. The problem is unchanged. The problem is controlled. Associated symptoms include anxiety. Pertinent negatives include no blurred vision, chest pain, headaches, malaise/fatigue, neck pain, orthopnea, palpitations, peripheral edema, PND, shortness of breath or sweats. Risk factors for coronary artery disease include stress. Past treatments include ACE inhibitors. There are no compliance problems.   Hyperlipidemia This is a chronic problem. The current episode started more than 1 year ago. The problem is controlled. Pertinent negatives include no chest pain, myalgias or shortness of breath. Current antihyperlipidemic treatment includes statins. The current treatment provides significant improvement of lipids. There are no compliance problems.   Anxiety Presents for follow-up visit. Symptoms include decreased concentration, depressed mood, excessive worry, insomnia, irritability and nervous/anxious behavior. Patient reports no chest pain, compulsions, confusion, dizziness, dry mouth, feeling of choking, muscle tension, nausea, palpitations, shortness of breath or suicidal ideas. Symptoms occur most days. The severity of symptoms is moderate. The quality of sleep is fair. Nighttime awakenings:  occasional.   Compliance with medications is 76-100%.  Insomnia Primary symptoms:  sleep disturbance, difficulty falling asleep, frequent awakening, no malaise/fatigue.  The current episode started more than one month. The problem occurs every several days. The problem has been waxing and waning since onset. The symptoms are aggravated by anxiety, family issues and SSRI use. The symptoms are relieved by relaxation and medication. Past treatments include medication. The treatment provided moderate relief. PMH includes: hypertension, family stress or anxiety.   GAD 7 : Generalized Anxiety Score 02/02/2019  Nervous, Anxious, on Edge 3  Control/stop worrying 3  Worry too much - different things 3  Trouble relaxing 1  Restless 0  Easily annoyed or irritable 1  Afraid - awful might happen 1  Total GAD 7 Score 12    Depression screen [facility] 2/9 02/02/2019 07/28/2018 04/21/2018 04/08/2018 10/17/2017  Decreased Interest 1 0 1 1 1   Down, Depressed, Hopeless 1 0 1 1 1   PHQ - 2 Score 2 0 2 2 2   Altered sleeping 1 - 1 1 1   Tired, decreased energy 1 - 1 1 1   Change in appetite 0 - 1 1 1   Feeling bad or failure about yourself  0 - 0 0 0  Trouble concentrating 1 - 1 1 0  Moving slowly or fidgety/restless 0 - 0 0 0  Suicidal thoughts 0 - 0 0 0  PHQ-9 Score 5 - 6 6 5   Difficult doing work/chores - - Not difficult at all Somewhat difficult -    Relevant past medical, surgical, family, and social history reviewed and updated as indicated.  Allergies and medications reviewed and updated.   Past Medical History:  Diagnosis Date  . Arthritis   . Cancer ([place]) 05/30/2016   Breast   . Hyperlipidemia   . Hypertension   . Osteoporosis     Past Surgical History:  Procedure Laterality Date  . CHOLECYSTECTOMY    . cyst removed from right breast    . Rotator cuff tear in right shoulder    . TUBAL LIGATION      Social History   Socioeconomic History  . Marital status: Married    Spouse name: Not on file  . Number of children: 2  . Years of education: 21  . Highest education level:  12th grade  Occupational History  . Occupation: Retired    Comment: Proofreader  . Financial resource strain: Not hard at all  . Food insecurity    Worry: Never true    Inability: Never true  . Transportation needs    Medical: Not on file    Non-medical: Not on file  Tobacco Use  . Smoking status: Never Smoker  . Smokeless tobacco: Never Used  Substance and Sexual Activity  . Alcohol use: Yes    Alcohol/week: 0.0 standard drinks    Comment: rarely   . Drug use: No  . Sexual activity: Not Currently  Lifestyle  . Physical activity    Days per week: Not on file    Minutes per session: Not on file  . Stress: Not on file  Relationships  . Social Herbalist on phone: Not on file    Gets together: Not on file    Attends religious service: Not on file    Active member of club or organization: Not on file    Attends meetings of clubs or organizations: Not on file    Relationship status: Not on file  .  Intimate partner violence    Fear of current or ex partner: Not on file    Emotionally abused: Not on file    Physically abused: Not on file    Forced sexual activity: Not on file  Other Topics Concern  . Not on file  Social History Narrative  . Not on file    Outpatient Encounter Medications as of 02/02/2019  Medication Sig  . alendronate (FOSAMAX) 70 MG tablet Take 1 tablet (70 mg total) by mouth every 7 (seven) days. Take with a full glass of water on an empty stomach.  Marland Kitchen amoxicillin-clavulanate (AUGMENTIN) 875-125 MG tablet Take 1 tablet by mouth 2 (two) times daily for 7 days.  Marland Kitchen atorvastatin (LIPITOR) 20 MG tablet Take 1 tablet (20 mg total) by mouth daily.  . cholecalciferol (VITAMIN D) 1000 UNITS tablet Take 1 tablet (1,000 Units total) by mouth daily.  . fluconazole (DIFLUCAN) 150 MG tablet Take 1 tablet (150 mg total) by mouth once for 1 dose.  . fluticasone (FLONASE) 50 MCG/ACT nasal spray Place 2 sprays into both nostrils daily.  Marland Kitchen lisinopril  (ZESTRIL) 10 MG tablet Take 1 tablet (10 mg total) by mouth daily.  . RESTASIS MULTIDOSE 0.05 % ophthalmic emulsion   . sertraline (ZOLOFT) 50 MG tablet Take 1 tablet (50 mg total) by mouth daily.  . temazepam (RESTORIL) 15 MG capsule TAKE ONE CAPSULE BY MOUTH ONCE DAILY AT BEDTIME  . [DISCONTINUED] sertraline (ZOLOFT) 25 MG tablet Take 1 tablet (25 mg total) by mouth daily.   No facility-administered encounter medications on file as of 02/02/2019.     No Known Allergies  Review of Systems  Constitutional: Positive for appetite change, fatigue and irritability. Negative for activity change, chills, diaphoresis, fever, malaise/fatigue and unexpected weight change.  HENT: Negative for sinus pressure.   Eyes: Negative for blurred vision, photophobia and visual disturbance.  Respiratory: Negative for cough, chest tightness, shortness of breath and wheezing.   Cardiovascular: Negative for chest pain, palpitations, orthopnea and PND.  Gastrointestinal: Negative for abdominal pain, anal bleeding, blood in stool, constipation, diarrhea, nausea and vomiting.  Genitourinary: Negative for decreased urine volume and difficulty urinating.  Musculoskeletal: Negative for arthralgias, myalgias and neck pain.  Neurological: Negative for dizziness, tremors, seizures, syncope, facial asymmetry, speech difficulty, weakness, light-headedness, numbness and headaches.  Psychiatric/Behavioral: Positive for decreased concentration, dysphoric mood and sleep disturbance. Negative for agitation, behavioral problems, confusion, hallucinations, self-injury and suicidal ideas. The patient is nervous/anxious and has insomnia. The patient is not hyperactive.          Observations/Objective: No vital signs or physical exam, this was a telephone or virtual health encounter.  Pt alert and oriented, answers all questions appropriately, and able to speak in full sentences.    Assessment and Plan: Yvonne Rivers was seen today for  medical management of chronic issues and dental pain.  Diagnoses and all orders for this visit:  Generalized anxiety disorder Worsening in symptoms over the last several weeks. Will increase Zoloft to 50 mg daily. Report any new or worsening symptoms. Follow up in 3 months.  -     sertraline (ZOLOFT) 50 MG tablet; Take 1 tablet (50 mg total) by mouth daily.  Dental abscess Reported symptoms concerning for dental abscess. Follow up with dentist as soon as possible. Symptomatic care discussed. Medications as prescribed. Report any new or worsening symptoms.  -     amoxicillin-clavulanate (AUGMENTIN) 875-125 MG tablet; Take 1 tablet by mouth 2 (two) times daily for 7 days.  History of candidal vulvovaginitis -     fluconazole (DIFLUCAN) 150 MG tablet; Take 1 tablet (150 mg total) by mouth once for 1 dose.  Essential hypertension, benign Doing well on current medications. Will follow up in 3 months for labs and reevaluation. Diet and exercise encouraged.   Hyperlipidemia with target LDL less than 100 Diet and exercise encouraged. Continue medications as prescribed. Labs at next visit.   Insomnia due to medical condition Increased with increasing anxiety. Sleep hygiene discussed. Report any new or worsening symptoms.     Follow Up Instructions: Return in about 3 months (around 05/05/2019), or if symptoms worsen or fail to improve, for HTN, lipids, GAD.    I discussed the assessment and treatment plan with the patient. The patient was provided an opportunity to ask questions and all were answered. The patient agreed with the plan and demonstrated an understanding of the instructions.   The patient was advised to call back or seek an in-person evaluation if the symptoms worsen or if the condition fails to improve as anticipated.  The above assessment and management plan was discussed with the patient. The patient verbalized understanding of and has agreed to the management plan. Patient is  aware to call the clinic if symptoms persist or worsen. Patient is aware when to return to the clinic for a follow-up visit. Patient educated on when it is appropriate to go to the emergency department.    I provided 25 minutes of non-face-to-face time during this encounter. The call started at 1200. The call ended at 1225. The other time was used for coordination of care.    Monia Pouch, FNP-C [facility] [address] [place], [place] [address] [phone]

## 2019-02-05 ENCOUNTER — Other Ambulatory Visit: Payer: Self-pay | Admitting: *Deleted

## 2019-02-05 DIAGNOSIS — R195 Other fecal abnormalities: Secondary | ICD-10-CM

## 2019-02-05 NOTE — Progress Notes (Signed)
Per cologaurd records - pt was positive 6 mos ago and we referred her to GI in [place] ( per pt request) she cancelled appt and never went.  I called pt today and asked about this. She is willing to go to GI and a NEW REFERRAL was placed today!

## 2019-02-21 ENCOUNTER — Other Ambulatory Visit: Payer: Self-pay | Admitting: Family Medicine

## 2019-02-21 DIAGNOSIS — M810 Age-related osteoporosis without current pathological fracture: Secondary | ICD-10-CM

## 2019-02-25 ENCOUNTER — Other Ambulatory Visit: Payer: Self-pay | Admitting: Family Medicine

## 2019-02-25 DIAGNOSIS — E785 Hyperlipidemia, unspecified: Secondary | ICD-10-CM

## 2019-02-25 DIAGNOSIS — I10 Essential (primary) hypertension: Secondary | ICD-10-CM

## 2019-04-21 ENCOUNTER — Other Ambulatory Visit: Payer: Self-pay | Admitting: Family Medicine

## 2019-04-21 DIAGNOSIS — M810 Age-related osteoporosis without current pathological fracture: Secondary | ICD-10-CM

## 2019-05-01 ENCOUNTER — Ambulatory Visit (INDEPENDENT_AMBULATORY_CARE_PROVIDER_SITE_OTHER): Payer: Medicare Other | Admitting: Family Medicine

## 2019-05-01 ENCOUNTER — Encounter: Payer: Self-pay | Admitting: Family Medicine

## 2019-05-01 DIAGNOSIS — F411 Generalized anxiety disorder: Secondary | ICD-10-CM | POA: Diagnosis not present

## 2019-05-01 DIAGNOSIS — H1031 Unspecified acute conjunctivitis, right eye: Secondary | ICD-10-CM

## 2019-05-01 MED ORDER — SERTRALINE HCL 100 MG PO TABS: 100.0000 mg | ORAL_TABLET | Freq: Every day | ORAL | 5 refills | Status: DC

## 2019-05-01 MED ORDER — POLYMYXIN B-TRIMETHOPRIM 10000-0.1 UNIT/ML-% OP SOLN: 2.0000 [drp] | OPHTHALMIC | 0 refills | Status: AC

## 2019-05-01 NOTE — Progress Notes (Signed)
Virtual Visit via telephone Note Due to COVID-19 pandemic this visit was conducted virtually. This visit type was conducted due to national recommendations for restrictions regarding the COVID-19 Pandemic (e.g. social distancing, sheltering in place) in an effort to limit this patient's exposure and mitigate transmission in our community. All issues noted in this document were discussed and addressed.  A physical exam was not performed with this format.   I connected with Yvonne Rivers on 05/01/19 at 1520 by telephone and verified that I am speaking with the correct person using two identifiers. Yvonne Chafe [place] is currently located at home and family is currently with them during visit. The provider, Monia Pouch, FNP is located in their office at time of visit.  I discussed the limitations, risks, security and privacy concerns of performing an evaluation and management service by telephone and the availability of in person appointments. I also discussed with the patient that there may be a patient responsible charge related to this service. The patient expressed understanding and agreed to proceed.  Subjective:  Patient ID: Yvonne Rivers, female    DOB: 27-Apr-1946, 73 y.o.   MRN: [id number]  Chief Complaint:  Anxiety and Conjunctivitis (right)   HPI: Yvonne Rivers is a 73 y.o. female presenting on 05/01/2019 for Anxiety and Conjunctivitis (right)   Pt reports increasing anxiety over the last several weeks. States she feels anxious and worries most days. Pt states she feels on edge when out in public or around more than 3-4 people. States she is not sure if this is because of the pandemic. States she just feels worse over the last few weeks.  She also reports redness and drainage from her right eye. States it was matted shut this morning when she woke up. No known sick contacts, no visual changes.   Anxiety Presents for follow-up visit. Symptoms include compulsions, decreased  concentration, excessive worry, insomnia, nervous/anxious behavior and obsessions. Patient reports no chest pain, confusion, depressed mood, dizziness, dry mouth, feeling of choking, hyperventilation, impotence, irritability, malaise, muscle tension, nausea, palpitations, panic, restlessness, shortness of breath or suicidal ideas. Symptoms occur most days. The severity of symptoms is moderate. The quality of sleep is fair. Nighttime awakenings: occasional.   Compliance with medications is 76-100%.  Conjunctivitis  The current episode started yesterday. The problem is mild. Nothing relieves the symptoms. Nothing aggravates the symptoms. Associated symptoms include eye discharge and eye redness. Pertinent negatives include no orthopnea, no fever, no decreased vision, no double vision, no eye itching, no photophobia, no abdominal pain, no constipation, no diarrhea, no nausea, no vomiting, no congestion, no ear discharge, no ear pain, no headaches, no hearing loss, no mouth sores, no rhinorrhea, no sore throat, no stridor, no swollen glands, no muscle aches, no neck pain, no neck stiffness, no cough, no URI, no wheezing, no rash and no eye pain.     Relevant past medical, surgical, family, and social history reviewed and updated as indicated.  Allergies and medications reviewed and updated.   Past Medical History:  Diagnosis Date   Arthritis    Cancer ([place]) 05/30/2016   Breast    Hyperlipidemia    Hypertension    Osteoporosis     Past Surgical History:  Procedure Laterality Date   CHOLECYSTECTOMY     cyst removed from right breast     Rotator cuff tear in right shoulder     TUBAL LIGATION      Social History   Socioeconomic History  Marital status: Married    Spouse name: Not on file   Number of children: 2   Years of education: 57   Highest education level: 12th grade  Occupational History   Occupation: Retired    Comment: Software engineer strain: Not hard at [employer] insecurity    Worry: Never true    Inability: Never true   Transportation needs    Medical: Not on file    Non-medical: Not on file  Tobacco Use   Smoking status: Never Smoker   Smokeless tobacco: Never Used  Substance and Sexual Activity   Alcohol use: Yes    Alcohol/week: 0.0 standard drinks    Comment: rarely    Drug use: No   Sexual activity: Not Currently  Lifestyle   Physical activity    Days per week: Not on file    Minutes per session: Not on file   Stress: Not on file  Relationships   Social connections    Talks on phone: Not on file    Gets together: Not on file    Attends religious service: Not on file    Active member of club or organization: Not on file    Attends meetings of clubs or organizations: Not on file    Relationship status: Not on file   Intimate partner violence    Fear of current or ex partner: Not on file    Emotionally abused: Not on file    Physically abused: Not on file    Forced sexual activity: Not on file  Other Topics Concern   Not on file  Social History Narrative   Not on file    Outpatient Encounter Medications as of 05/01/2019  Medication Sig   alendronate (FOSAMAX) 70 MG tablet TAKE 1 TABLET BY MOUTH  WEEKLY WITH A FULL GLASS OF WATER ON AN EMPTY STOMACH   atorvastatin (LIPITOR) 20 MG tablet TAKE 1 TABLET BY MOUTH  DAILY   cholecalciferol (VITAMIN D) 1000 UNITS tablet Take 1 tablet (1,000 Units total) by mouth daily.   fluticasone (FLONASE) 50 MCG/ACT nasal spray Place 2 sprays into both nostrils daily.   lisinopril (ZESTRIL) 10 MG tablet TAKE 1 TABLET BY MOUTH  DAILY   RESTASIS MULTIDOSE 0.05 % ophthalmic emulsion    sertraline (ZOLOFT) 100 MG tablet Take 1 tablet (100 mg total) by mouth daily.   temazepam (RESTORIL) 15 MG capsule TAKE ONE CAPSULE BY MOUTH ONCE DAILY AT BEDTIME   trimethoprim-polymyxin b (POLYTRIM) ophthalmic solution Place 2 drops into the right eye  every 4 (four) hours for 7 days.   [DISCONTINUED] sertraline (ZOLOFT) 50 MG tablet Take 1 tablet (50 mg total) by mouth daily.   No facility-administered encounter medications on file as of 05/01/2019.     No Known Allergies  Review of Systems  Constitutional: Negative for activity change, appetite change, chills, diaphoresis, fatigue, fever, irritability and unexpected weight change.  HENT: Negative.  Negative for congestion, ear discharge, ear pain, hearing loss, mouth sores, rhinorrhea and sore throat.   Eyes: Positive for discharge and redness. Negative for double vision, photophobia, pain, itching and visual disturbance.  Respiratory: Negative for cough, chest tightness, shortness of breath, wheezing and stridor.   Cardiovascular: Negative for chest pain, palpitations, orthopnea and leg swelling.  Gastrointestinal: Negative for abdominal pain, blood in stool, constipation, diarrhea, nausea and vomiting.  Endocrine: Negative.   Genitourinary: Negative for decreased urine volume, difficulty urinating, dysuria, frequency, impotence and  urgency.  Musculoskeletal: Negative for arthralgias, myalgias and neck pain.  Skin: Negative.  Negative for rash.  Allergic/Immunologic: Negative.   Neurological: Negative for dizziness, weakness and headaches.  Hematological: Negative.   Psychiatric/Behavioral: Positive for decreased concentration and dysphoric mood. Negative for agitation, behavioral problems, confusion, hallucinations, self-injury, sleep disturbance and suicidal ideas. The patient is nervous/anxious and has insomnia. The patient is not hyperactive.   All other systems reviewed and are negative.        Observations/Objective: No vital signs or physical exam, this was a telephone or virtual health encounter.  Pt alert and oriented, answers all questions appropriately, and able to speak in full sentences.    Assessment and Plan: Lacey was seen today for anxiety and  conjunctivitis.  Diagnoses and all orders for this visit:  Generalized anxiety disorder Increasing over the last several days. Will increase sertraline to 100 mg daily. Report any new, worsening, or persistent symptoms. Follow up in 4 weeks or sooner if needed.  -     sertraline (ZOLOFT) 100 MG tablet; Take 1 tablet (100 mg total) by mouth daily.  Acute bacterial conjunctivitis of right eye Reported symptoms consistent with bacterial conjunctivitis. Will treat with below. Report any new or worsening symptoms.  -     trimethoprim-polymyxin b (POLYTRIM) ophthalmic solution; Place 2 drops into the right eye every 4 (four) hours for 7 days.     Follow Up Instructions: Return if symptoms worsen or fail to improve.    I discussed the assessment and treatment plan with the patient. The patient was provided an opportunity to ask questions and all were answered. The patient agreed with the plan and demonstrated an understanding of the instructions.   The patient was advised to call back or seek an in-person evaluation if the symptoms worsen or if the condition fails to improve as anticipated.  The above assessment and management plan was discussed with the patient. The patient verbalized understanding of and has agreed to the management plan. Patient is aware to call the clinic if they develop any new symptoms or if symptoms persist or worsen. Patient is aware when to return to the clinic for a follow-up visit. Patient educated on when it is appropriate to go to the emergency department.    I provided 25 minutes of non-face-to-face time during this encounter. The call started at 1520. The call ended at 1545. The other time was used for coordination of care.    Monia Pouch, FNP-C [facility] [address] [place], [place] [address] [phone] 05/01/19

## 2019-05-14 ENCOUNTER — Ambulatory Visit (INDEPENDENT_AMBULATORY_CARE_PROVIDER_SITE_OTHER): Payer: Medicare Other | Admitting: Family Medicine

## 2019-05-14 ENCOUNTER — Encounter: Payer: Self-pay | Admitting: Family Medicine

## 2019-05-14 ENCOUNTER — Other Ambulatory Visit: Payer: Self-pay

## 2019-05-14 VITALS — BP 139/88 | HR 78 | Temp 98.9°F | Ht 60.0 in | Wt 110.0 lb

## 2019-05-14 DIAGNOSIS — M25552 Pain in left hip: Secondary | ICD-10-CM | POA: Diagnosis not present

## 2019-05-14 DIAGNOSIS — Z23 Encounter for immunization: Secondary | ICD-10-CM

## 2019-05-14 MED ORDER — PREDNISONE 10 MG (21) PO TBPK: ORAL_TABLET | ORAL | 0 refills | Status: DC

## 2019-05-14 NOTE — Progress Notes (Addendum)
Assessment & Plan:  1. Acute pain of left hip - Education provided on hip pain. Continue heat and muscle rubs as needed to provide comfort.  - predniSONE (STERAPRED UNI-PAK 21 TAB) 10 MG (21) TBPK tablet; Use as directed on back of pill pack  Dispense: 21 tablet; Refill: 0   Follow up plan: Return if symptoms worsen or fail to improve.  Hendricks Limes, MSN, APRN, FNP-C [facility]  Subjective:   Patient ID: Yvonne Rivers, female    DOB: 1946/06/20, 73 y.o.   MRN: [id number]  HPI: Yvonne Rivers is a 73 y.o. female presenting on 05/14/2019 for Hip Pain  Hip Pain: Patient complains of left hip pain. Onset of the symptoms was several days ago. Inciting event: patient was moving furniture around recently. Current symptoms include radiates to right foot. Describes pain as sharp shooting. Associated symptoms: none. Aggravating symptoms: rising after sitting and standing. Patient's course of pain: gradually worsening. Patient has had no prior hip problems. Previous visits for this problem: none. Evaluation to date: none.  Treatment to date: heat, muscle and joint rubs.   ROS: Negative unless specifically indicated above in HPI.   Relevant past medical history reviewed and updated as indicated.   Allergies and medications reviewed and updated.   Current Outpatient Medications:  .  alendronate (FOSAMAX) 70 MG tablet, TAKE 1 TABLET BY MOUTH  WEEKLY WITH A FULL GLASS OF WATER ON AN EMPTY STOMACH, Disp: 12 tablet, Rfl: 3 .  atorvastatin (LIPITOR) 20 MG tablet, TAKE 1 TABLET BY MOUTH  DAILY, Disp: 90 tablet, Rfl: 3 .  cholecalciferol (VITAMIN D) 1000 UNITS tablet, Take 1 tablet (1,000 Units total) by mouth daily., Disp: , Rfl:  .  fluticasone (FLONASE) 50 MCG/ACT nasal spray, Place 2 sprays into both nostrils daily., Disp: 16 g, Rfl: 6 .  lisinopril (ZESTRIL) 10 MG tablet, TAKE 1 TABLET BY MOUTH  DAILY, Disp: 90 tablet, Rfl: 3 .  RESTASIS MULTIDOSE 0.05 % ophthalmic  emulsion, , Disp: , Rfl:  .  sertraline (ZOLOFT) 100 MG tablet, Take 1 tablet (100 mg total) by mouth daily., Disp: 30 tablet, Rfl: 5 .  temazepam (RESTORIL) 15 MG capsule, TAKE ONE CAPSULE BY MOUTH ONCE DAILY AT BEDTIME, Disp: 90 capsule, Rfl: 0 .  predniSONE (STERAPRED UNI-PAK 21 TAB) 10 MG (21) TBPK tablet, Use as directed on back of pill pack, Disp: 21 tablet, Rfl: 0  No Known Allergies  Objective:   BP 139/88   Pulse 78   Temp 98.9 F (37.2 C) (Temporal)   Ht 5' (1.524 m)   Wt 110 lb (49.9 kg)   SpO2 98%   BMI 21.48 kg/m    Physical Exam Vitals signs reviewed.  Constitutional:      General: She is not in acute distress.    Appearance: Normal appearance. She is normal weight. She is not ill-appearing, toxic-appearing or diaphoretic.  HENT:     Head: Normocephalic and atraumatic.  Eyes:     General: No scleral icterus.       Right eye: No discharge.        Left eye: No discharge.     Conjunctiva/sclera: Conjunctivae normal.  Neck:     Musculoskeletal: Normal range of motion.  Cardiovascular:     Rate and Rhythm: Normal rate.  Pulmonary:     Effort: Pulmonary effort is normal. No respiratory distress.  Musculoskeletal: Normal range of motion.     Left hip: She exhibits tenderness. She exhibits  normal range of motion, normal strength, no swelling, no crepitus, no deformity and no laceration.     Lumbar back: She exhibits normal range of motion, no tenderness, no bony tenderness, no swelling, no edema, no deformity, no laceration, no pain, no spasm and normal pulse.     Comments: Negative straight leg raise.   Skin:    General: Skin is warm and dry.     Capillary Refill: Capillary refill takes less than 2 seconds.  Neurological:     General: No focal deficit present.     Mental Status: She is alert and oriented to person, place, and time. Mental status is at baseline.  Psychiatric:        Mood and Affect: Mood normal.        Behavior: Behavior normal.        Thought  Content: Thought content normal.        Judgment: Judgment normal.

## 2019-05-14 NOTE — Patient Instructions (Signed)

## 2019-05-22 ENCOUNTER — Telehealth: Payer: Self-pay | Admitting: Family Medicine

## 2019-05-22 DIAGNOSIS — M25552 Pain in left hip: Secondary | ICD-10-CM

## 2019-05-22 MED ORDER — PREDNISONE 10 MG (21) PO TBPK: ORAL_TABLET | ORAL | 0 refills | Status: DC

## 2019-05-22 NOTE — Telephone Encounter (Signed)
Patient states her leg is still hurting. Patient states the prednisone helped while she was on it.

## 2019-05-22 NOTE — Telephone Encounter (Signed)
Patient aware and verbalized understanding. °

## 2019-05-22 NOTE — Telephone Encounter (Signed)
What is the name of the medication? Wants another RX of prednisone called in   Have you contacted your pharmacy to request a refill? NO  Which pharmacy would you like this sent to? [facility] in [place]   Patient notified that their request is being sent to the clinical staff for review and that they should receive a call once it is complete. If they do not receive a call within 24 hours they can check with their pharmacy or our office.

## 2019-06-13 ENCOUNTER — Other Ambulatory Visit: Payer: Self-pay | Admitting: Family Medicine

## 2019-06-13 DIAGNOSIS — F411 Generalized anxiety disorder: Secondary | ICD-10-CM

## 2019-07-06 ENCOUNTER — Other Ambulatory Visit: Payer: Self-pay | Admitting: Family Medicine

## 2019-07-06 DIAGNOSIS — G4700 Insomnia, unspecified: Secondary | ICD-10-CM

## 2019-07-08 ENCOUNTER — Ambulatory Visit: Payer: Medicare Other | Admitting: Family Medicine

## 2019-07-10 ENCOUNTER — Other Ambulatory Visit: Payer: Self-pay | Admitting: Family Medicine

## 2019-07-10 DIAGNOSIS — F411 Generalized anxiety disorder: Secondary | ICD-10-CM

## 2019-07-14 ENCOUNTER — Ambulatory Visit (INDEPENDENT_AMBULATORY_CARE_PROVIDER_SITE_OTHER): Payer: Medicare Other | Admitting: Family Medicine

## 2019-07-14 ENCOUNTER — Encounter: Payer: Self-pay | Admitting: Family Medicine

## 2019-07-14 DIAGNOSIS — G8929 Other chronic pain: Secondary | ICD-10-CM

## 2019-07-14 DIAGNOSIS — M25552 Pain in left hip: Secondary | ICD-10-CM

## 2019-07-14 DIAGNOSIS — G4700 Insomnia, unspecified: Secondary | ICD-10-CM

## 2019-07-14 MED ORDER — TEMAZEPAM 15 MG PO CAPS: ORAL_CAPSULE | ORAL | 3 refills | Status: AC

## 2019-07-14 MED ORDER — MELOXICAM 7.5 MG PO TABS: 7.5000 mg | ORAL_TABLET | Freq: Every day | ORAL | 0 refills | Status: DC

## 2019-07-14 NOTE — Progress Notes (Signed)
Virtual Visit via telephone Note Due to COVID-19 pandemic this visit was conducted virtually. This visit type was conducted due to national recommendations for restrictions regarding the COVID-19 Pandemic (e.g. social distancing, sheltering in place) in an effort to limit this patient's exposure and mitigate transmission in our community. All issues noted in this document were discussed and addressed.  A physical exam was not performed with this format.   I connected with Yvonne Rivers on 07/14/2019 at 0755 by telephone and verified that I am speaking with the correct person using two identifiers. Yvonne Rivers is currently located at home and family is currently with them during visit. The provider, Monia Pouch, FNP is located in their office at time of visit.  I discussed the limitations, risks, security and privacy concerns of performing an evaluation and management service by telephone and the availability of in person appointments. I also discussed with the patient that there may be a patient responsible charge related to this service. The patient expressed understanding and agreed to proceed.  Subjective:  Patient ID: Yvonne Rivers, female    DOB: 12/28/1945, 73 y.o.   MRN: [id number]  Chief Complaint:  Hip Pain   HPI: Yvonne Rivers is a 73 y.o. female presenting on 07/14/2019 for Hip Pain   Chronic left hip pain. Intermittent in nature, no known injury.   Hip Pain  The incident occurred more than 1 week ago. There was no injury mechanism. The pain is present in the left leg and left hip. The pain is at a severity of 5/10. The pain is moderate. The pain has been intermittent since onset. Pertinent negatives include no inability to bear weight, loss of motion, loss of sensation, muscle weakness, numbness or tingling. The symptoms are aggravated by movement and weight bearing. She has tried acetaminophen, ice and NSAIDs for the symptoms. The treatment provided mild relief.   Insomnia Primary symptoms: fragmented sleep, sleep disturbance, difficulty falling asleep.  The current episode started more than one month. The problem occurs nightly. The problem has been waxing and waning since onset. The symptoms are relieved by medication. Past treatments include meditation. The treatment provided significant relief. Typical bedtime:  8-10 P.M..      Relevant past medical, surgical, family, and social history reviewed and updated as indicated.  Allergies and medications reviewed and updated.   Past Medical History:  Diagnosis Date  . Arthritis   . Cancer ([place]) 05/30/2016   Breast   . Hyperlipidemia   . Hypertension   . Osteoporosis     Past Surgical History:  Procedure Laterality Date  . CHOLECYSTECTOMY    . cyst removed from right breast    . Rotator cuff tear in right shoulder    . TUBAL LIGATION      Social History   Socioeconomic History  . Marital status: Married    Spouse name: Not on file  . Number of children: 2  . Years of education: 35  . Highest education level: 12th grade  Occupational History  . Occupation: Retired    Comment: Waitress  Tobacco Use  . Smoking status: Never Smoker  . Smokeless tobacco: Never Used  Substance and Sexual Activity  . Alcohol use: Yes    Alcohol/week: 0.0 standard drinks    Comment: rarely   . Drug use: No  . Sexual activity: Not Currently  Other Topics Concern  . Not on file  Social History Narrative  . Not on file   Social  Determinants of Health   Financial Resource Strain:   . Difficulty of Paying Living Expenses: Not on file  Food Insecurity:   . Worried About Charity fundraiser in the Last Year: Not on file  . Ran Out of Food in the Last Year: Not on file  Transportation Needs:   . Lack of Transportation (Medical): Not on file  . Lack of Transportation (Non-Medical): Not on file  Physical Activity:   . Days of Exercise per Week: Not on file  . Minutes of Exercise per Session: Not on  file  Stress:   . Feeling of Stress : Not on file  Social Connections:   . Frequency of Communication with Friends and Family: Not on file  . Frequency of Social Gatherings with Friends and Family: Not on file  . Attends Religious Services: Not on file  . Active Member of Clubs or Organizations: Not on file  . Attends Archivist Meetings: Not on file  . Marital Status: Not on file  Intimate Partner Violence:   . Fear of Current or Ex-Partner: Not on file  . Emotionally Abused: Not on file  . Physically Abused: Not on file  . Sexually Abused: Not on file    Outpatient Encounter Medications as of 07/14/2019  Medication Sig  . alendronate (FOSAMAX) 70 MG tablet TAKE 1 TABLET BY MOUTH  WEEKLY WITH A FULL GLASS OF WATER ON AN EMPTY STOMACH  . atorvastatin (LIPITOR) 20 MG tablet TAKE 1 TABLET BY MOUTH  DAILY  . cholecalciferol (VITAMIN D) 1000 UNITS tablet Take 1 tablet (1,000 Units total) by mouth daily.  . fluticasone (FLONASE) 50 MCG/ACT nasal spray Place 2 sprays into both nostrils daily.  Marland Kitchen lisinopril (ZESTRIL) 10 MG tablet TAKE 1 TABLET BY MOUTH  DAILY  . meloxicam (MOBIC) 7.5 MG tablet Take 1 tablet (7.5 mg total) by mouth daily.  . predniSONE (STERAPRED UNI-PAK 21 TAB) 10 MG (21) TBPK tablet Use as directed on back of pill pack  . RESTASIS MULTIDOSE 0.05 % ophthalmic emulsion   . sertraline (ZOLOFT) 100 MG tablet Take 1 tablet (100 mg total) by mouth daily.  . temazepam (RESTORIL) 15 MG capsule TAKE ONE CAPSULE BY MOUTH ONCE DAILY AT BEDTIME  . [DISCONTINUED] temazepam (RESTORIL) 15 MG capsule TAKE ONE CAPSULE BY MOUTH ONCE DAILY AT BEDTIME   No facility-administered encounter medications on file as of 07/14/2019.    No Known Allergies  Review of Systems  Constitutional: Negative for activity change, appetite change, chills, diaphoresis, fatigue, fever and unexpected weight change.  HENT: Negative.   Eyes: Negative.   Respiratory: Negative for cough, chest  tightness and shortness of breath.   Cardiovascular: Negative for chest pain, palpitations and leg swelling.  Gastrointestinal: Negative for abdominal pain, blood in stool, constipation, diarrhea, nausea and vomiting.  Endocrine: Negative.   Genitourinary: Negative for decreased urine volume, difficulty urinating, dysuria, frequency and urgency.  Musculoskeletal: Positive for arthralgias, gait problem (due to hip pain) and myalgias.  Skin: Negative.   Allergic/Immunologic: Negative.   Neurological: Negative for dizziness, tingling, numbness and headaches.  Hematological: Negative.   Psychiatric/Behavioral: Positive for sleep disturbance. Negative for agitation, behavioral problems, confusion, decreased concentration, dysphoric mood, hallucinations, self-injury and suicidal ideas. The patient has insomnia. The patient is not nervous/anxious and is not hyperactive.   All other systems reviewed and are negative.        Observations/Objective: No vital signs or physical exam, this was a telephone or virtual health encounter.  Pt alert and oriented, answers all questions appropriately, and able to speak in full sentences.    Assessment and Plan: Jennell was seen today for hip pain.  Diagnoses and all orders for this visit:  Chronic left hip pain Ongoing left hip pain despite NSAIDs, prednisone, tylenol, and topical rubs. Will get imaging today. Will trial Mobic daily. Pt aware to stop other NSAIDs. Will refer to ortho once imaging has been completed. Symptomatic care discussed in detail.  -     meloxicam (MOBIC) 7.5 MG tablet; Take 1 tablet (7.5 mg total) by mouth daily. -     DG HIP UNILAT WITH PELVIS 2-3 VIEWS LEFT; Future  Insomnia, unspecified type Does well on below. Will continue.  -     temazepam (RESTORIL) 15 MG capsule; TAKE ONE CAPSULE BY MOUTH ONCE DAILY AT BEDTIME     Follow Up Instructions: Return if symptoms worsen or fail to improve.    I discussed the assessment  and treatment plan with the patient. The patient was provided an opportunity to ask questions and all were answered. The patient agreed with the plan and demonstrated an understanding of the instructions.   The patient was advised to call back or seek an in-person evaluation if the symptoms worsen or if the condition fails to improve as anticipated.  The above assessment and management plan was discussed with the patient. The patient verbalized understanding of and has agreed to the management plan. Patient is aware to call the clinic if they develop any new symptoms or if symptoms persist or worsen. Patient is aware when to return to the clinic for a follow-up visit. Patient educated on when it is appropriate to go to the emergency department.    I provided 15 minutes of non-face-to-face time during this encounter. The call started at 0755. The call ended at 0810. The other time was used for coordination of care.    Monia Pouch, FNP-C [facility] [address] [place], [place] [address] [phone] 07/14/2019

## 2019-07-20 ENCOUNTER — Encounter: Payer: Self-pay | Admitting: Nurse Practitioner

## 2019-07-20 ENCOUNTER — Ambulatory Visit (INDEPENDENT_AMBULATORY_CARE_PROVIDER_SITE_OTHER): Payer: Medicare Other

## 2019-07-20 ENCOUNTER — Ambulatory Visit (INDEPENDENT_AMBULATORY_CARE_PROVIDER_SITE_OTHER): Payer: Medicare Other | Admitting: Nurse Practitioner

## 2019-07-20 ENCOUNTER — Other Ambulatory Visit: Payer: Self-pay

## 2019-07-20 VITALS — BP 169/94 | HR 79 | Temp 98.4°F | Resp 20 | Ht 60.0 in | Wt 113.0 lb

## 2019-07-20 DIAGNOSIS — M25559 Pain in unspecified hip: Secondary | ICD-10-CM | POA: Diagnosis not present

## 2019-07-20 DIAGNOSIS — M25552 Pain in left hip: Secondary | ICD-10-CM

## 2019-07-20 DIAGNOSIS — G8929 Other chronic pain: Secondary | ICD-10-CM | POA: Diagnosis not present

## 2019-07-20 MED ORDER — KETOROLAC TROMETHAMINE 60 MG/2ML IM SOLN
60.0000 mg | Freq: Once | INTRAMUSCULAR | Status: AC
  Administered 2019-07-20: 15:00:00 60 mg via INTRAMUSCULAR

## 2019-07-20 NOTE — Patient Instructions (Signed)

## 2019-07-20 NOTE — Progress Notes (Signed)
Subjective:    Patient ID: Yvonne Rivers, female    DOB: Nov 23, 1945, 73 y.o.   MRN: [id number]   Chief Complaint: left hip and leg pain (Daughter wants MMSE)   HPI Patient is brought in by her daughter today with the following complaints: - patient is c/o left hip pain that radiates all the way don hre left leg. Has been hurting for at least a month. She has been seeing M. Rakes and has been given mobic and prednisone and that has not helped at all. Rates pain 9/10. Nothing helps it and nothing makes it worse, it just hurts all the time. There is an order in chart for xray of left hip. - daughter wanted MME done today. Has family history of dementia.  MMSE - Mini Mental State Exam 07/20/2019 07/20/2019 04/08/2018 03/21/2016 11/25/2014  Orientation to time 3 3 4 5 5   Orientation to Place 5 5 5 5 5   Registration 3 3 3 3 3   Attention/ Calculation 3 3 2 4  0  Recall 0 0 0 3 3  Language- name 2 objects 2 2 2 2 2   Language- repeat 1 1 1 1 1   Language- follow 3 step command 2 2 3 3 3   Language- read & follow direction 1 1 1 1 1   Write a sentence 1 1 1 1 1   Copy design 0 0 1 1 1   Total score 21 21 23 29 25       Review of Systems  Constitutional: Negative for diaphoresis.  Eyes: Negative for pain.  Respiratory: Negative for shortness of breath.   Cardiovascular: Negative for chest pain, palpitations and leg swelling.  Gastrointestinal: Negative for abdominal pain.  Endocrine: Negative for polydipsia.  Musculoskeletal: Positive for arthralgias (left hip radiating down leg).  Skin: Negative for rash.  Neurological: Negative for dizziness, weakness and headaches.  Hematological: Does not bruise/bleed easily.  All other systems reviewed and are negative.      Objective:   Physical Exam Vitals and nursing note reviewed.  Constitutional:      Appearance: Normal appearance.  Cardiovascular:     Rate and Rhythm: Normal rate and regular rhythm.     Heart sounds: Normal heart sounds.    Pulmonary:     Effort: Pulmonary effort is normal.     Breath sounds: Normal breath sounds.  Musculoskeletal:     Comments: Left hip pain with abduction. No pain with other movements of left hip. No point tenderness  Neurological:     General: No focal deficit present.     Mental Status: She is alert and oriented to person, place, and time.  Psychiatric:        Mood and Affect: Mood normal.        Behavior: Behavior normal.    BP (!) 169/94   Pulse 79   Temp 98.4 F (36.9 C) (Temporal)   Resp 20   Ht 5' (1.524 m)   Wt 113 lb (51.3 kg)   SpO2 96%   BMI 22.07 kg/m   Left hip osteoarthritis- Preliminary reading by Ronnald Collum, FNP  [facility] Lumbar xray-slight compression at l3-4 as well as scoliosis of lumbar region.      Assessment & Plan:  Vickii Chafe Eltzroth in today with chief complaint of left hip and leg pain (Daughter wants MMSE)   1. Hip pain Moist heat Rest  Fall precautions - DG Lumbar Spine 2-3 Views; Future - Ambulatory referral to Orthopedic Surgery - ketorolac (TORADOL) injection  60 mg  Mary-Margaret Hassell Done, FNP

## 2019-07-31 ENCOUNTER — Telehealth: Payer: Self-pay | Admitting: *Deleted

## 2019-07-31 DIAGNOSIS — G4700 Insomnia, unspecified: Secondary | ICD-10-CM

## 2019-07-31 DIAGNOSIS — F411 Generalized anxiety disorder: Secondary | ICD-10-CM

## 2019-07-31 DIAGNOSIS — I10 Essential (primary) hypertension: Secondary | ICD-10-CM

## 2019-07-31 DIAGNOSIS — M810 Age-related osteoporosis without current pathological fracture: Secondary | ICD-10-CM

## 2019-07-31 DIAGNOSIS — E785 Hyperlipidemia, unspecified: Secondary | ICD-10-CM

## 2019-07-31 NOTE — Telephone Encounter (Signed)
VM from [place], NP w/ [facility] Saw pt yesterday, did quantiflow testing for PAD Left 0.64, Right 0.85 significant change in left, moderate in right No symptoms, is on ASA Said pt recently had an AWV, did not do well with the mini mental status, did no good with the clock and something else Daughter didn't know there were meds for memory. They are not currently happy w/ [facility] in the mist of changing providers Please follow up with pt and family

## 2019-08-03 NOTE — Telephone Encounter (Signed)
What concerns do you have?  If she would like a referral to neurology pertaining to her MMSE, we can place that. I would like to place a referral to vascular pertaining to her ABI completed by home health.

## 2019-08-06 ENCOUNTER — Other Ambulatory Visit: Payer: Self-pay | Admitting: Family Medicine

## 2019-08-06 DIAGNOSIS — M810 Age-related osteoporosis without current pathological fracture: Secondary | ICD-10-CM

## 2019-08-06 DIAGNOSIS — I10 Essential (primary) hypertension: Secondary | ICD-10-CM

## 2019-08-06 DIAGNOSIS — F411 Generalized anxiety disorder: Secondary | ICD-10-CM

## 2019-08-06 DIAGNOSIS — G8929 Other chronic pain: Secondary | ICD-10-CM

## 2019-08-06 DIAGNOSIS — G609 Hereditary and idiopathic neuropathy, unspecified: Secondary | ICD-10-CM

## 2019-08-06 DIAGNOSIS — M25552 Pain in left hip: Secondary | ICD-10-CM

## 2019-08-06 DIAGNOSIS — E785 Hyperlipidemia, unspecified: Secondary | ICD-10-CM

## 2019-08-06 DIAGNOSIS — R413 Other amnesia: Secondary | ICD-10-CM

## 2019-08-06 MED ORDER — MELOXICAM 7.5 MG PO TABS: 7.5000 mg | ORAL_TABLET | Freq: Every day | ORAL | 0 refills | Status: AC

## 2019-08-06 MED ORDER — ATORVASTATIN CALCIUM 20 MG PO TABS: 20.0000 mg | ORAL_TABLET | Freq: Every day | ORAL | 2 refills | Status: AC

## 2019-08-06 MED ORDER — ALENDRONATE SODIUM 70 MG PO TABS: ORAL_TABLET | ORAL | 2 refills | Status: AC

## 2019-08-06 MED ORDER — ATORVASTATIN CALCIUM 20 MG PO TABS: 20.0000 mg | ORAL_TABLET | Freq: Every day | ORAL | 2 refills | Status: DC

## 2019-08-06 MED ORDER — LISINOPRIL 10 MG PO TABS: 10.0000 mg | ORAL_TABLET | Freq: Every day | ORAL | 2 refills | Status: DC

## 2019-08-06 MED ORDER — SERTRALINE HCL 100 MG PO TABS: 100.0000 mg | ORAL_TABLET | Freq: Every day | ORAL | 4 refills | Status: DC

## 2019-08-06 MED ORDER — SERTRALINE HCL 100 MG PO TABS: 100.0000 mg | ORAL_TABLET | Freq: Every day | ORAL | 4 refills | Status: AC

## 2019-08-06 MED ORDER — LISINOPRIL 10 MG PO TABS: 10.0000 mg | ORAL_TABLET | Freq: Every day | ORAL | 2 refills | Status: AC

## 2019-08-06 MED ORDER — ALENDRONATE SODIUM 70 MG PO TABS: ORAL_TABLET | ORAL | 2 refills | Status: DC

## 2019-08-06 NOTE — Telephone Encounter (Signed)
TC with Jaclyn Shaggy about call from [facility] NP Would like to go ahead with both referrals to the Neurologist & Vascular Also discussed changing her pharmacy to [facility] to start pill packs and delivery for her since her daughter lives in [place] and with patients memory this may help better with medication adherence.

## 2019-08-06 NOTE — Telephone Encounter (Signed)
Daughter aware, temazepam will also be put in pill pack from [facility], they transferred the script from [facility]

## 2019-08-06 NOTE — Telephone Encounter (Signed)
Placed referrals and sent medications to [facility] minus the controlled substance. This will have to be picked up at [facility].

## 2019-08-06 NOTE — Addendum Note (Signed)
Addended by: Antonietta Barcelona D on: 08/06/2019 09:56 AM   Modules accepted: Orders

## 2019-08-07 NOTE — Telephone Encounter (Signed)
Aware. 

## 2019-08-10 ENCOUNTER — Other Ambulatory Visit: Payer: Self-pay | Admitting: Family Medicine

## 2019-08-10 DIAGNOSIS — G609 Hereditary and idiopathic neuropathy, unspecified: Secondary | ICD-10-CM

## 2019-08-10 DIAGNOSIS — R413 Other amnesia: Secondary | ICD-10-CM

## 2019-09-17 DIAGNOSIS — E785 Hyperlipidemia, unspecified: Secondary | ICD-10-CM | POA: Diagnosis not present

## 2019-09-17 DIAGNOSIS — R413 Other amnesia: Secondary | ICD-10-CM | POA: Diagnosis not present

## 2019-09-17 DIAGNOSIS — I10 Essential (primary) hypertension: Secondary | ICD-10-CM | POA: Diagnosis not present

## 2019-09-17 DIAGNOSIS — M810 Age-related osteoporosis without current pathological fracture: Secondary | ICD-10-CM | POA: Diagnosis not present

## 2019-09-17 DIAGNOSIS — F5101 Primary insomnia: Secondary | ICD-10-CM | POA: Diagnosis not present

## 2019-11-02 ENCOUNTER — Other Ambulatory Visit: Payer: Self-pay | Admitting: Family Medicine

## 2019-11-02 DIAGNOSIS — G4700 Insomnia, unspecified: Secondary | ICD-10-CM

## 2019-11-26 ENCOUNTER — Encounter (INDEPENDENT_AMBULATORY_CARE_PROVIDER_SITE_OTHER): Payer: Medicare Other | Admitting: Ophthalmology

## 2019-11-27 ENCOUNTER — Encounter (INDEPENDENT_AMBULATORY_CARE_PROVIDER_SITE_OTHER): Payer: Medicare Other | Admitting: Ophthalmology

## 2019-12-03 ENCOUNTER — Encounter (INDEPENDENT_AMBULATORY_CARE_PROVIDER_SITE_OTHER): Payer: Medicare Other | Admitting: Ophthalmology

## 2019-12-16 ENCOUNTER — Encounter (INDEPENDENT_AMBULATORY_CARE_PROVIDER_SITE_OTHER): Payer: Medicare Other | Admitting: Ophthalmology

## 2020-02-12 ENCOUNTER — Encounter (INDEPENDENT_AMBULATORY_CARE_PROVIDER_SITE_OTHER): Payer: Medicare Other | Admitting: Ophthalmology

## 2020-04-14 ENCOUNTER — Other Ambulatory Visit: Payer: Self-pay | Admitting: *Deleted

## 2020-04-14 DIAGNOSIS — E785 Hyperlipidemia, unspecified: Secondary | ICD-10-CM

## 2020-04-14 NOTE — Telephone Encounter (Signed)
Former [employer] order not sent. Last chronic ckup 02/02/19 last lipid 08/16/16

## 2020-04-15 NOTE — Telephone Encounter (Signed)
Pt aware and will have her daughter call us back to schedule follow up since her daughter does all of her appointments.

## 2020-09-16 ENCOUNTER — Other Ambulatory Visit: Payer: Self-pay | Admitting: *Deleted

## 2020-09-16 DIAGNOSIS — M810 Age-related osteoporosis without current pathological fracture: Secondary | ICD-10-CM

## 2020-11-09 IMAGING — DX DG HIP (WITH OR WITHOUT PELVIS) 2-3V*L*
4 series · 4 of 4 positions shown · non-contrast
Comparison: None.

CLINICAL DATA: Left hip pain.

EXAM:
DG HIP (WITH OR WITHOUT PELVIS) 2-3V LEFT

[pelvis ap (1 of 2)]
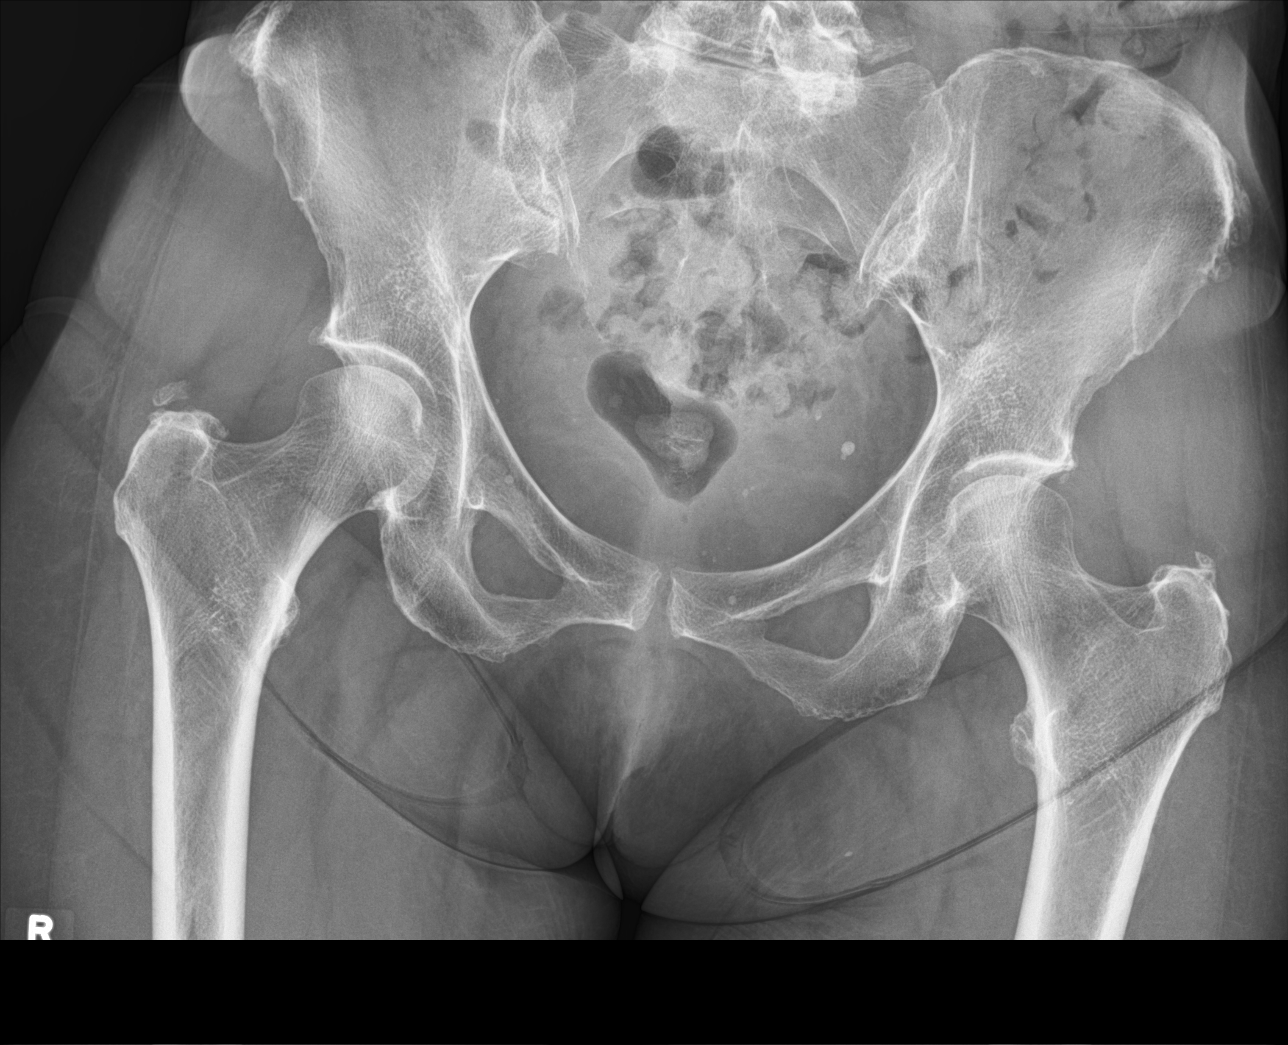

[hip ap]
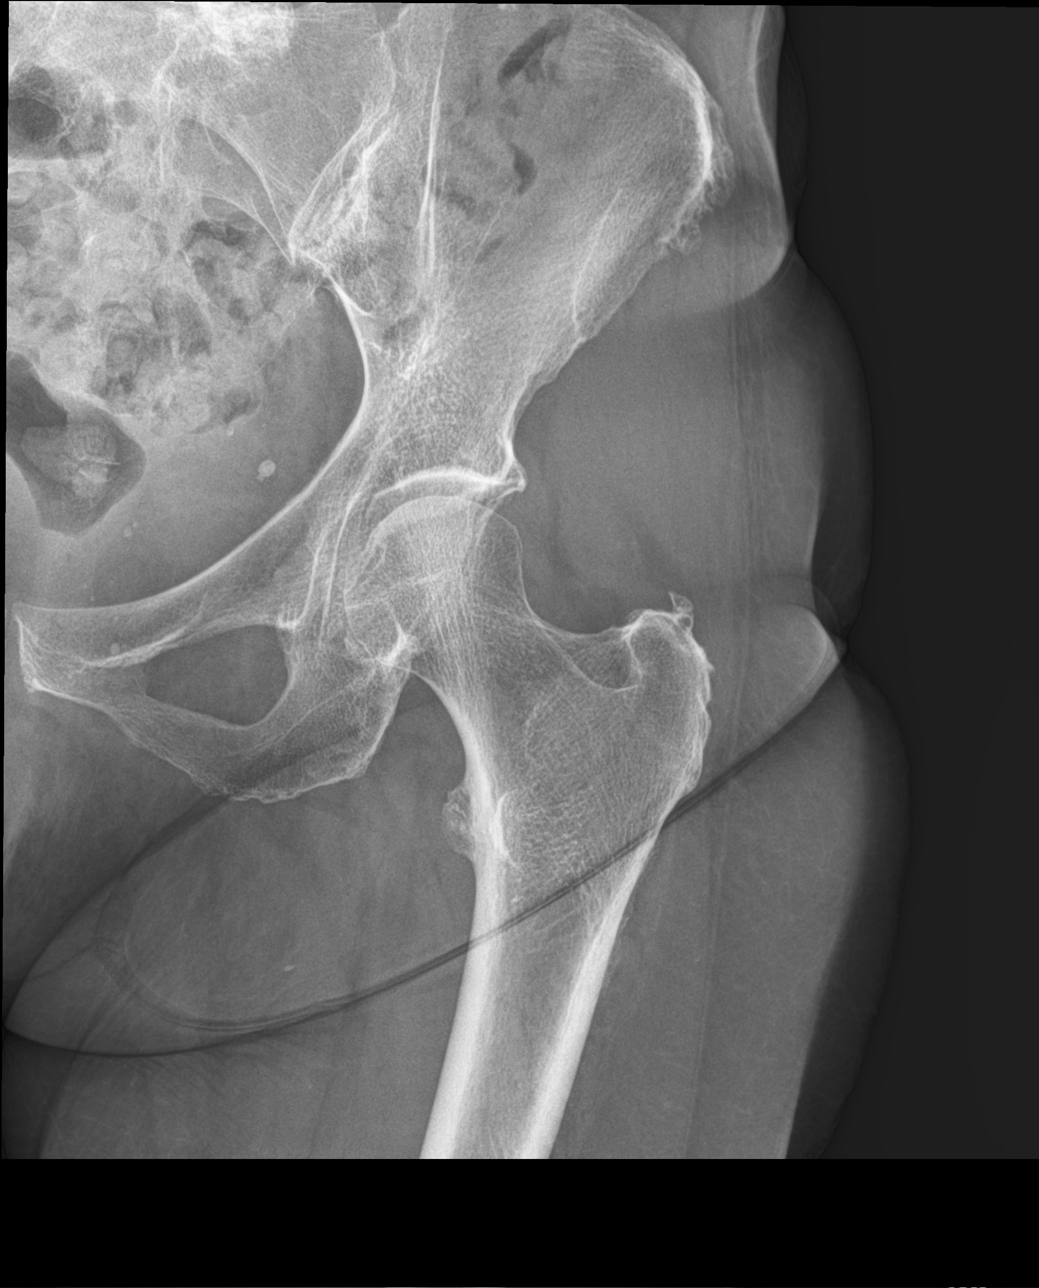

[hip lat]
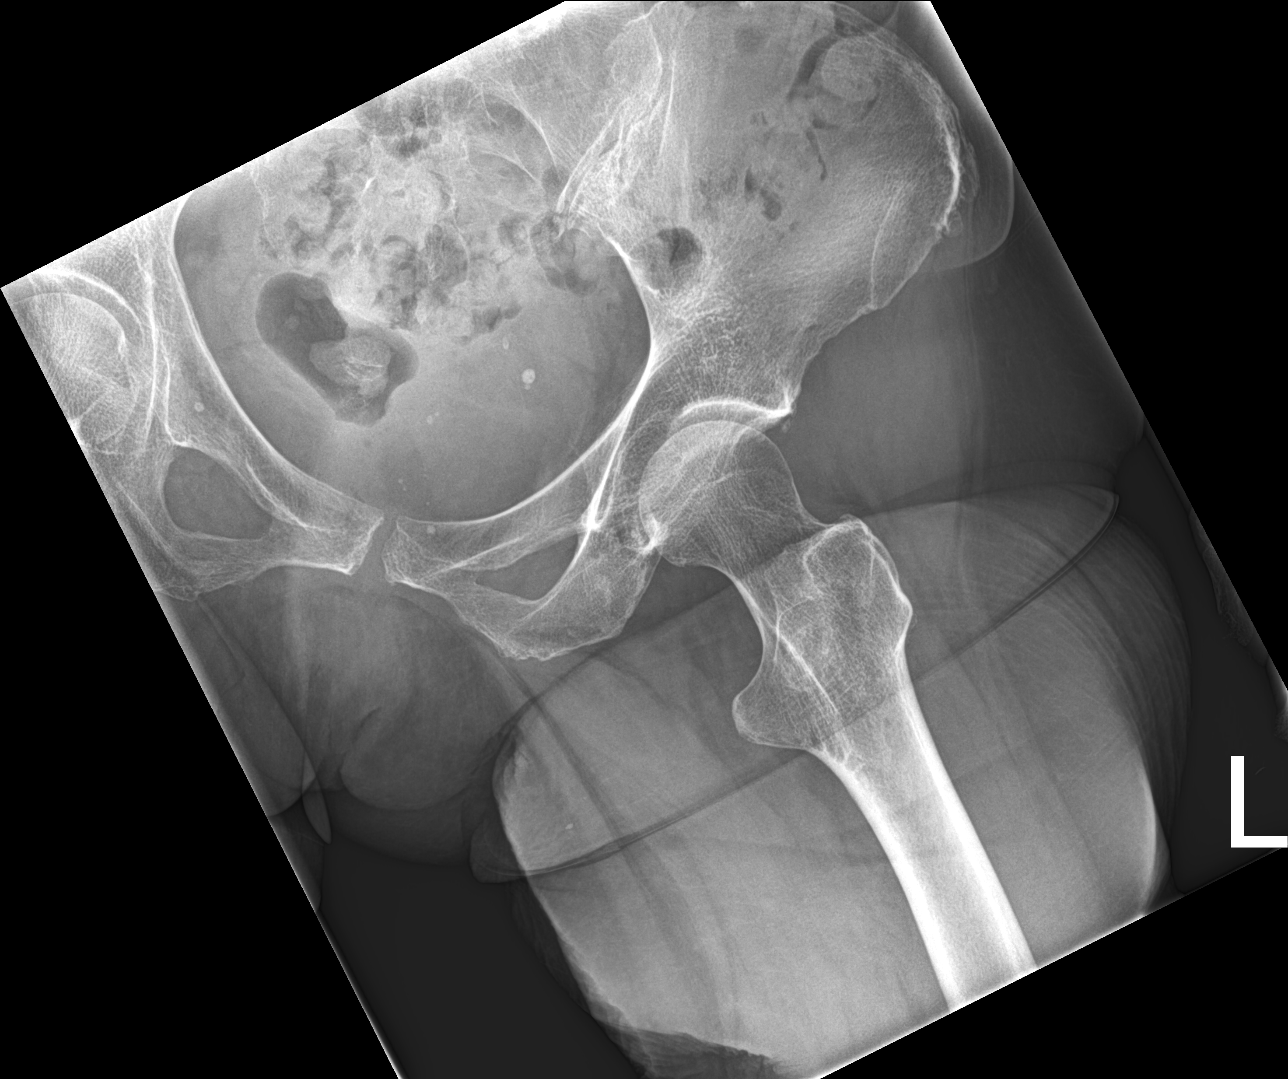

[pelvis ap (2 of 2)]
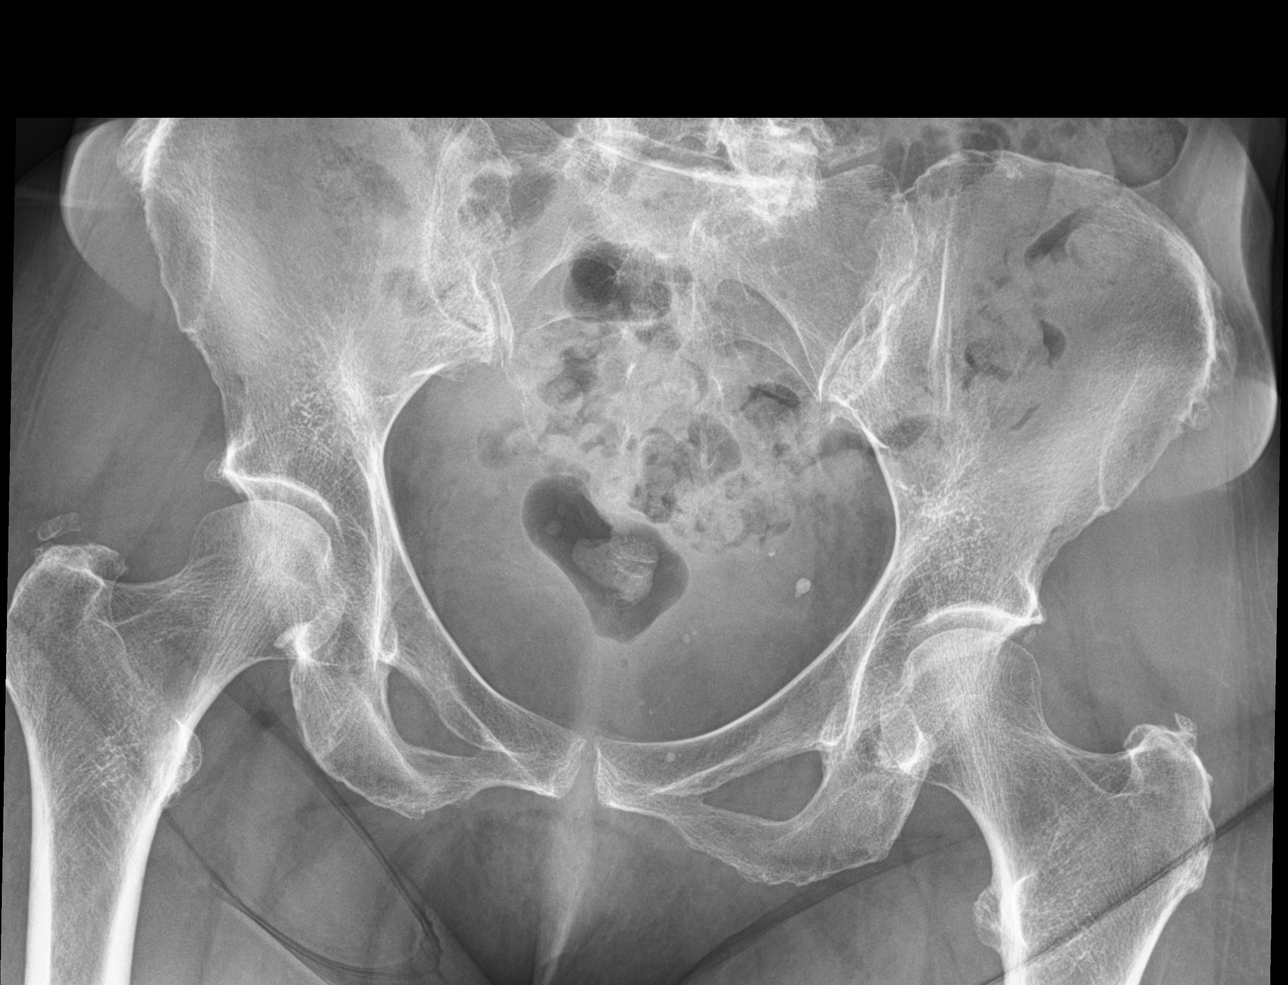

[4 of 4 positions shown; findings below may reference images not displayed]

FINDINGS: There is no acute displaced fracture or dislocation. There is
mild-to-moderate bilateral hip osteoarthritis, left worse than
right. Phleboliths project over the patient's pelvis.
IMPRESSION: 1. No acute displaced fracture or dislocation.
2. Mild bilateral hip osteoarthritis, left worse than right.

## 2020-11-29 ENCOUNTER — Other Ambulatory Visit: Payer: Self-pay | Admitting: Family

## 2020-11-29 DIAGNOSIS — I10 Essential (primary) hypertension: Secondary | ICD-10-CM
